# Patient Record
Sex: Male | Born: 1980 | Race: White | Hispanic: No | Marital: Married | Smoking: Never smoker
Health system: Southern US, Community
[De-identification: ages and names within clinical notes are randomized; demographics above are authoritative.]

## PROBLEM LIST (undated history)

## (undated) DIAGNOSIS — F410 Panic disorder [episodic paroxysmal anxiety] without agoraphobia: Secondary | ICD-10-CM

## (undated) DIAGNOSIS — F32A Depression, unspecified: Secondary | ICD-10-CM

## (undated) DIAGNOSIS — I10 Essential (primary) hypertension: Secondary | ICD-10-CM

## (undated) DIAGNOSIS — F209 Schizophrenia, unspecified: Secondary | ICD-10-CM

## (undated) DIAGNOSIS — F329 Major depressive disorder, single episode, unspecified: Secondary | ICD-10-CM

## (undated) DIAGNOSIS — F319 Bipolar disorder, unspecified: Secondary | ICD-10-CM

## (undated) DIAGNOSIS — Z87442 Personal history of urinary calculi: Secondary | ICD-10-CM

## (undated) DIAGNOSIS — F419 Anxiety disorder, unspecified: Secondary | ICD-10-CM

## (undated) HISTORY — PX: HERNIA REPAIR: SHX51

---
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
## 2002-01-01 ENCOUNTER — Emergency Department (HOSPITAL_COMMUNITY): Admission: EM | Admit: 2002-01-01 | Discharge: 2002-01-01 | Payer: Self-pay | Admitting: Internal Medicine

## 2002-07-25 ENCOUNTER — Inpatient Hospital Stay (HOSPITAL_COMMUNITY): Admission: EM | Admit: 2002-07-25 | Discharge: 2002-07-26 | Payer: Self-pay | Admitting: Internal Medicine

## 2002-07-25 ENCOUNTER — Encounter: Payer: Self-pay | Admitting: Internal Medicine

## 2005-03-26 ENCOUNTER — Emergency Department (HOSPITAL_COMMUNITY): Admission: EM | Admit: 2005-03-26 | Discharge: 2005-03-26 | Payer: Self-pay | Admitting: Emergency Medicine

## 2005-04-14 ENCOUNTER — Ambulatory Visit (HOSPITAL_COMMUNITY): Admission: RE | Admit: 2005-04-14 | Discharge: 2005-04-14 | Payer: Self-pay | Admitting: Urology

## 2008-11-05 ENCOUNTER — Emergency Department (HOSPITAL_COMMUNITY): Admission: EM | Admit: 2008-11-05 | Discharge: 2008-11-05 | Payer: Self-pay | Admitting: Emergency Medicine

## 2011-03-31 NOTE — Discharge Summary (Signed)
   NAMEDREZDEN, SEITZINGER                       ACCOUNT NO.:  [id number]   MEDICAL RECORD NO.:  [id number]                   PATIENT TYPE:  INP   LOCATION:  IC03                                 FACILITY:  [facility]   PHYSICIAN:  Gracelyn Nurse, M.D.              DATE OF BIRTH:  [birth year]   DATE OF ADMISSION:  07/25/2002  DATE OF DISCHARGE:  07/26/2002                                 DISCHARGE SUMMARY   DISCHARGE DIAGNOSES:  1. Drug overdose with Benadryl.  2. Schizoaffective disorder:  The patient discharged to [facility].  3. History of multiple suicide attempts.   DISCHARGE MEDICATIONS:  1. Keppra 1 g b.i.d.  2. Abilify 35 mg q.a.m.  3. Lamictal 300 mg q.h.s.  4. Synthroid 125 mcg q.d.  5. Risperdal  0.5 mg b.i.d.  6. Klonopin 2 mg q.a.m., 1 p.m., and q.h.s.   REASON FOR ADMISSION:  This is a 30 year old white male.  He had multiple  admissions for suicide attempts in the past.  He presents after taking  approximately 100 over-the-counter Benadryl tablets.  The patient is unable  to give information but his mother is present who was able to give a good  history.   HOSPITAL COURSE:  1. Drug overdose.  As noted above, this was with Benadryl.  The patient was     tachycardic, had dilated pupils, but other than this did not have any     other ill effects.  He was medically cleared by the next day.  At that     time psychiatry saw him.  They did recommend voluntary commitment at     [facility], and the patient will be transferred there.  2. Schizoaffective disorder.  He was maintained on his current medications     and this will be followed up during the psych evaluation.   DISPOSITION:  The patient was discharged in stable condition.                                                Gracelyn Nurse, M.D.    JDJ/MEDQ  D:  08/25/2002  T:  08/27/2002  Job:  [id number]

## 2011-03-31 NOTE — Discharge Summary (Signed)
[facility]  Patient:    Devin Allen, Devin Allen Visit Number: [id number] MRN: [id number]          Service Type: PSY Location: 500 0500 02 Attending Physician:  Denny Peon Dictated by:   Reymundo Poll Dub Mikes, M.D. Admit Date:  09/21/2001 Discharge Date: 09/24/2001                             Discharge Summary  CHIEF COMPLAINT AND HISTORY OF PRESENT ILLNESS:  This was the first admission to [facility] for this 30 year old male who came to the emergency room at [facility] taken by the mother as he had taken eight Tylenol tablets to go sleep.  Reported suicidal thoughts intermittently since high school, confused thinking, and rapid thoughts.  Denies any auditory or visual hallucinations, sleeping 12 hours a day, withdrawn, rocking in a chair. Initially felt better on Celexa, then suicidal thoughts returned.  He promised safety while on the unit.  Denied active homicidal ideas.  Confused about sexual identity.  Graphic dreams of death.  PAST PSYCHIATRIC HISTORY:  [facility], [facility] October 10 to October 25.  Has history of prior suicide attempts in 2000.  FAMILY HISTORY:  Schizoaffective disorder.  SUBSTANCE ABUSE HISTORY:  Denies the use or abuse of any substances.  PAST MEDICAL HISTORY:  Noncontributory.  MEDICATIONS ON ADMISSION: 1. Celexa 40 mg per day. 2. Risperdal 4 mg at bedtime. 3. Cogentin 1 mg at bedtime.  PHYSICAL EXAMINATION:  GENERAL:  Performed and failed to show any acute findings.  MENTAL STATUS EXAMINATION ON ADMISSION:  Well-nourished, well-developed male dressed in inappropriate dress in stocking cap and jacket in very warm room, fidgety.  Speech: There was no pressure, normal pace and tone, some difficulty verbalizing what was going on.  Mood: Depressed.  Affect: Mildly guarded.  Did admit to intrusive thoughts, morbid dreams.  Thought processes: Kind of fragmented  and disorganized.  Admitted to suicidal ideas but no plan; no homicidal ideas.  Oriented to person, place, and time.  Recent and remote memory were well preserved.  ADMITTING DIAGNOSES: Axis I:    1. Schizoaffective disorder, depressed.            2. Rule out psychosis, not otherwise specified.            3. Major depression with psychotic features. Axis II:   No diagnosis. Axis III:  No diagnosis. Axis IV:   Moderate. Axis V:    Global assessment of functioning upon admission 30, highest global            assessment of functioning in the last year 65-70.  HOSPITAL COURSE:  He was admitted and started in intensive individual and group psychotherapy.  He was kept on Risperdal 4 mg every day, Celexa 40 mg, and Cogentin.  Risperdal was decreased and he was started on Geodon 20 mg in the morning and 40 mg at bedtime.  By November 11, he felt he was feeling much better, still was somewhat reserved, guarded, but clearing to himself why he was in hospital, the triggers, what he needed to do to prevent to have to come back into the hospital.  There was a family session where mother said that he was definitely much better.  On November 12, he was in full contact with reality, mood improved, affect bright, no suicidal ideas, no homicidal ideas, no evidence of active psychosis.  As he was willing to pursue outpatient followup, he was discharged.  DISCHARGE DIAGNOSES: Axis I:    Rule out schizoaffective disorder, depressed versus psychotic            disorder, not otherwise specified. Axis II:   No diagnosis. Axis III:  No diagnosis. Axis IV:   Moderate. Axis V:    Global assessment of functioning upon discharge 60.  DISCHARGE MEDICATIONS: 1. Celexa 40 mg per day. 2. Cogentin 0.5 mg at bedtime. 3. Risperdal 1 mg at bedtime. 4. Geodon 20 mg in the morning and 40 mg at bedtime.  FOLLOWUP:  [facility]. Dictated by:   Reymundo Poll Dub Mikes, M.D. Attending Physician:   Denny Peon DD:  10/30/01 TD:  11/02/01 Job: [id number]

## 2011-03-31 NOTE — H&P (Signed)
[facility]  Patient:    Devin Allen, Devin Allen Visit Number: [id number] MRN: [id number]          Service Type: PSY Location: 500 0500 02 Attending Physician:  Denny Peon Dictated by:   Young Berry Scott, R.N. N.P. Admit Date:  09/21/2001 Discharge Date: 09/24/2001                     Psychiatric Admission Assessment  DATE OF ADMISSION:  September 21, 2001  DATE OF ASSESSMENT:  September 21, 2001, at 1:15 p.m.  PATIENT IDENTIFICATION:  This is an 30 year old male who is single, Caucasian. He is a voluntary admission.  HISTORY OF PRESENT ILLNESS:  This patient presented to the emergency room at [facility] and was taken there by his mother after he told her that he had taken eight Tylenol tablets "to go to sleep."  The patient had reported suicidal thoughts intermittently since high school along with confused thinking and rapid thoughts.  He denies any auditory or visual hallucinations when asked directly.  He does describe "intrusive thoughts" and was concerned that in his own mind he was questioning his sexual preference.  However, he notes that these are primarily intrusive thoughts, that he has never had a homosexual relationship and, in fact, he had a heterosexual relationship for some time in high school but broke it off because of the stress that it placed on him.  The patient reports that he has had graphic dreams of death and had these dreams even intermittently back in high school but they have been more prominent over the last one to two months.  The patient is able to promise safety on the unit.  He denies any homicidal ideation.  PAST PSYCHIATRIC HISTORY:  The patient is followed by [facility] where he has been seen one time since his discharge from [facility] where he was treated October 10 through October 25.  That was the patients first psychiatric admission.  This admission to [facility] is his first admission here and his second psychiatric admission.  The patient has a history of prior suicide attempt by overdose in October.  SUBSTANCE ABUSE HISTORY:  The patient is a nonsmoker.  He is a nondrinker.  He denies any use of illegal drugs.  PAST MEDICAL HISTORY:  The patient is followed by Dr. Foster Simpson in [place] and the patient was last seen there three years ago.  More recently he has been followed at [facility] on an as needed basis.  He denies any current medical problems.  He does have a history of hernia repair twice when he was a small child and denies any other history of current medical problems or surgeries.  MEDICATIONS: 1. Celexa 40 mg p.o. q.d. 2. Risperdal 4 mg p.o. q.h.s. 3. Cogentin 1 mg p.o. q.h.s.  DRUG ALLERGIES:  PENICILLIN.  PHYSICAL EXAMINATION:  GENERAL:  The patients physical examination was unremarkable in the [facility].  He is a well-nourished, well-developed, healthy appearing young male who is in no acute distress.  VITAL SIGNS:  On admission to the unit, the patient weighed 208 pounds. Temperature was 97.9, pulse 102, blood pressure 139/71.  LABORATORY DATA:  Urine drug screen was negative.  CBC was within normal limits.  Chemistry was within normal limits.  Creatinine 1.1, BUN 13.  Tylenol level was within acceptable limits as validated by the emergency room prior to  transfer.  SOCIAL HISTORY:  The patient is the younger of two siblings.  He was at [employer] studying general college subjects but then dropped out because feeling that college was too stressful for him.  He was educated through high school, states that he was able to make As and Bs by studying very little or not at all.  The patients father died when he was a Printmaker in high school of cancer.  The patient currently lives with his mother and his brother.  He has  never married.  He has no children.  He is currently not working.  He denies any legal problems or any specific financial problems.  FAMILY HISTORY:  Positive for a father with a history of either schizoaffective disorder or schizophrenia; this is unclear.  MENTAL STATUS EXAMINATION:  This is a healthy appearing white male who is inappropriately dressed in a stocking cap and a heavy jacket in a warm room. He is fidgeting with poor eye contact, looking from side to side during the interview and having difficulty formulating his words.  Speech is without pressure but is soft in tone; otherwise normal pace.  He has quite a bit of difficulty verbalizing his thoughts and response latency is noted.  Mood is depressed and mildly guarded.  Thought process is fragmented and disjointed and remarkable for his intrusive thoughts and morbid dreams.  He does appear to have some internal stimulation going on.  He is positive for suicidal ideation without any specific plan or intent and no evidence of homicidal ideation.  Cognitive: Intact x 4.  It is noted that Dr. Lourdes Sledge is in on session for his mental status examination and history and formulates the plan.  ADMISSION DIAGNOSES: Axis I:    Rule out schizoaffective disorder. Axis II:   Deferred. Axis III:  None. Axis IV:   Deferred. Axis V:    Current 30, past year 72.  INITIAL PLAN OF CARE:  Plan is to voluntarily admit the patient to stabilize his mood and to alleviate his suicidal ideation and to help improve his reality testing.  We will plan a family session with his mother, Nettie Elm, as soon as possible.  Meanwhile, we will decrease his Risperdal to 2 mg at h.s., decrease his Cogentin to 0.5 mg at h.s. and then q.6h. p.r.n. for EPS, and we will start him on Geodon 20 mg at h.s. tonight and then 20 mg b.i.d. tomorrow. The risks and benefits of the Geodon were discussed with the patient and he is agreeable to this plan.  ESTIMATED LENGTH OF  STAY:  Four to six days. Dictated by:   Young Berry Scott, R.N. N.P. Attending Physician:  Denny Peon DD:  09/30/01  TD:  09/30/01 Job: [id number]

## 2011-03-31 NOTE — H&P (Signed)
Devin Allen, Devin Allen                       ACCOUNT NO.:  [id number]   MEDICAL RECORD NO.:  [id number]                   PATIENT TYPE:  EMS   LOCATION:  ED                                   FACILITY:  [facility]   PHYSICIAN:  Hanley Hays. Dechurch, M.D.           DATE OF BIRTH:  [birth year]   DATE OF ADMISSION:  07/25/2002  DATE OF DISCHARGE:                                HISTORY & PHYSICAL   HISTORY OF PRESENT ILLNESS:  The patient is 30 year old Caucasian male  followed by Dr. Renard Matter with a past medical history remarkable for  schizoaffective disorder followed by Dr. Joaquin Courts, his  psychologist.  He has had multiple inpatient psychiatric admissions and  multiple suicide attempts since diagnosis October 2002.  Tonight the patient  presents after taking approximately 100 over-the-counter Benadryl tablets.  The patient is unable to give any information but his mother is present, is  a good informant.  Other than his schizoaffective disorder which has been  quite troubling, he also has hypothyroidism which was recently diagnosed.  Past surgical history pertinent for hernia repair x2 as a child.  The  patient received ipecac in the emergency room and did have some vomiting but  no pill fragments.  He is being admitted to the intensive care unit for  observation and clearance prior to being transferred to inpatient psychiatry  for suicidal ideation and attempt.   ALLERGIES:  PENICILLIN.   MEDICATIONS:  1. Keppra 1 g b.i.d.  2. Abilify 30 mg q.a.m.  3. Lamictal 300 mg q.h.s.  4. Synthroid 125 mcg q.d.  5. Risperdal 0.5 mg b.i.d. (new).  6. Klonopin 2 mg q.a.m., 1 q.p.m., and 1 q.h.s.   SOCIAL HISTORY:  The patient is a Consulting civil engineer.  He did not start having problems  that were overt until the beginning of his freshman year at [employer].  No tobacco or alcohol or drug use.  He has one brother who is alive  and well.  His father deserted them in 1989.   FAMILY MEDICAL  HISTORY:  Pertinent for father with schizoaffective disorder.   PHYSICAL EXAMINATION:  GENERAL:  An obese young male who is very difficult  to understand secondary to his speech, which is abnormal.  He is able to  answer some simple questions, follows simple commands.  VITAL SIGNS:  Pulse 130, respirations unlabored, blood pressure on  presentation 165/98, O2 saturations 99%.  HEENT:  The oropharynx is dry.  His pupils are dilated.  Teeth are in fair  repair.  NECK:  Supple.  No JVD or adenopathy.  LUNGS:  Clear anteriorly and posteriorly though diminished secondary to  habitus.  HEART:  Tachycardic and regular.  ABDOMEN:  Reveals striae, soft, positive bowel sounds.  EXTREMITIES:  Without clubbing, cyanosis, or edema.  His pulses are intact.  GENITOURINARY:  The patient has a Foley catheter in place.  SKIN:  Otherwise without rash, lesion, or  breakdown.  NEUROLOGIC:  The patient moves all extremities.  He can answer some simple  questions though difficult to understand secondary to dysarthria.  He is  somewhat confused which, of course, is not his baseline state.   ASSESSMENT AND PLAN:  1. Suicide attempt with intended Benadryl overdose.  His mother keeps his     medications and he only receives them one week at a time; therefore, it     is not suspected there have been any other medications involved.  The     Benadryl bottle was found at the seen and produced by the patient.  Will     admit to ICU with telemetry, with IV fluids support and monitoring and     treating untoward effects of the anticholinergic response.  The patient     is mildly hypokalemic and alkalotic.  Will gently replete his potassium,     follow BMP.  He does have some mild increase in his SGOT, SGPT, but is on     Lamictal as well as has been on Depakote.  No previous labs available to     compare to.  2. Mild hypoxemia.  Blood gas revealed a PO2 of 65.  Will supplement and     monitor.   The ACT team will be  consulted once the patient is stable from the medical  standpoint.  The patient's status was discussed with his mother, who has  good understanding.                                               Hanley Hays Josefine Class, M.D.    FED/MEDQ  D:  07/25/2002  T:  07/26/2002  Job:  [id number]

## 2011-11-14 DIAGNOSIS — R569 Unspecified convulsions: Secondary | ICD-10-CM

## 2011-11-14 HISTORY — DX: Unspecified convulsions: R56.9

## 2011-12-23 ENCOUNTER — Other Ambulatory Visit: Payer: Self-pay

## 2011-12-23 ENCOUNTER — Emergency Department (HOSPITAL_COMMUNITY)
Admission: EM | Admit: 2011-12-23 | Discharge: 2011-12-23 | Disposition: A | Discharge status: Home / Self Care | Payer: Medicare Other | Attending: Emergency Medicine | Admitting: Emergency Medicine

## 2011-12-23 ENCOUNTER — Encounter (HOSPITAL_COMMUNITY): Payer: Self-pay

## 2011-12-23 ENCOUNTER — Emergency Department (HOSPITAL_COMMUNITY): Payer: Medicare Other

## 2011-12-23 DIAGNOSIS — R404 Transient alteration of awareness: Secondary | ICD-10-CM | POA: Insufficient documentation

## 2011-12-23 DIAGNOSIS — R55 Syncope and collapse: Secondary | ICD-10-CM | POA: Insufficient documentation

## 2011-12-23 DIAGNOSIS — F3289 Other specified depressive episodes: Secondary | ICD-10-CM | POA: Insufficient documentation

## 2011-12-23 DIAGNOSIS — R42 Dizziness and giddiness: Secondary | ICD-10-CM | POA: Insufficient documentation

## 2011-12-23 DIAGNOSIS — M79609 Pain in unspecified limb: Secondary | ICD-10-CM | POA: Insufficient documentation

## 2011-12-23 DIAGNOSIS — F329 Major depressive disorder, single episode, unspecified: Secondary | ICD-10-CM | POA: Insufficient documentation

## 2011-12-23 DIAGNOSIS — R413 Other amnesia: Secondary | ICD-10-CM | POA: Insufficient documentation

## 2011-12-23 HISTORY — DX: Major depressive disorder, single episode, unspecified: F32.9

## 2011-12-23 HISTORY — DX: Depression, unspecified: F32.A

## 2011-12-23 LAB — CBC
HCT: 37.2 % — ABNORMAL LOW (ref 39.0–52.0)
Hemoglobin: 12.8 g/dL — ABNORMAL LOW (ref 13.0–17.0)
MCHC: 34.4 g/dL (ref 30.0–36.0)

## 2011-12-23 LAB — COMPREHENSIVE METABOLIC PANEL
BUN: 14 mg/dL (ref 6–23)
CO2: 25 mEq/L (ref 19–32)
Chloride: 105 mEq/L (ref 96–112)
Creatinine, Ser: 1.01 mg/dL (ref 0.50–1.35)
GFR calc Af Amer: >90 mL/min (ref >90)
GFR calc non Af Amer: >90 mL/min (ref >90)
Glucose, Bld: 92 mg/dL (ref 70–99)
Total Bilirubin: 0.3 mg/dL (ref 0.3–1.2)

## 2011-12-23 LAB — DIFFERENTIAL
Basophils Relative: 0 % (ref 0–1)
Lymphs Abs: 1.2 10*3/uL (ref 0.7–4.0)
Monocytes Absolute: 0.5 10*3/uL (ref 0.1–1.0)
Monocytes Relative: 7 % (ref 3–12)
Neutro Abs: 5.3 10*3/uL (ref 1.7–7.7)

## 2011-12-23 MED ORDER — SODIUM CHLORIDE 0.9 % IV SOLN
Freq: Once | INTRAVENOUS | Status: AC
  Administered 2011-12-23: 20:00:00 via INTRAVENOUS

## 2011-12-23 NOTE — ED Notes (Signed)
Started zyprexa 2 days ago, pt does not remember yelling out on phone, just remembers being dizzy.

## 2011-12-23 NOTE — ED Notes (Signed)
Per mother pt began yelling out, then pt "blacked out", pt was found in the floor by family members.

## 2011-12-23 NOTE — ED Provider Notes (Signed)
History   This chart was scribed for Geoffery Lyons, MD by Melba Coon. The patient was seen in room APA04/APA04 and the patient's care was started at 7:05PM.    CSN: [id number]  Arrival date & time 12/23/11  4098   First MD Initiated Contact with Patient 12/23/11 1851      Chief Complaint  Patient presents with  . Loss of Consciousness    (Consider location/radiation/quality/duration/timing/severity/associated sxs/prior treatment) HPI Devin Allen is a 31 y.o. male who presents to the Emergency Department complaining of a moderate to severe episode of LOC with an onset this evening. Pt was talking on the phone with his mother when the mother heard the pt yell "Ahhhh" like somebody was chasing him. Mother tried to get pt to respond on the phone and failed; mother then called brother to go to the house and check on the pt. Mother then called for EMS. Brother found pt on the floor unconscious laying between the dining room and living room. Pt states that he felt lightheaded about 2 hrs before he passed and does not remember the conversation with his mother or his mother yelling at him on the phone. Pt states that he does not fell like himself and he feels "out of it". Mother also states that pt is on new medication (Zyprexa) started 3 days ago.  Right leg pain pertaining to fall present. No SOB, neck pain, CP, abd pain, or incontinence. Hx of depression. No Hx of seizures, diabetes, or heart problems.  Past Medical History  Diagnosis Date  . Depression     Past Surgical History  Procedure Date  . Hernia repair     No family history on file.  History  Substance Use Topics  . Smoking status: Never Smoker   . Smokeless tobacco: Not on file  . Alcohol Use: No      Review of Systems 10 Systems reviewed and are negative for acute change except as noted in the HPI.  Allergies  Review of patient's allergies indicates not on file.  Home Medications  No current outpatient  prescriptions on file.  BP 116/55  Pulse 120  Temp(Src) 97.6 F (36.4 C) (Oral)  Resp 20  Ht 6' (1.829 m)  Wt 190 lb (86.183 kg)  BMI 25.77 kg/m2  SpO2 98%  Physical Exam  Nursing note and vitals reviewed. Constitutional: He appears well-developed and well-nourished.       Awake, alert, nontoxic appearance.  HENT:  Head: Normocephalic and atraumatic.  Right Ear: External ear normal.  Left Ear: External ear normal.  Mouth/Throat: Oropharynx is clear and moist. No oropharyngeal exudate.  Eyes: EOM are normal. Pupils are equal, round, and reactive to light. Right eye exhibits no discharge. Left eye exhibits no discharge.  Neck: Normal range of motion. Neck supple.  Cardiovascular: Normal rate, regular rhythm and normal heart sounds.   No murmur heard. Pulmonary/Chest: Effort normal and breath sounds normal. No stridor. No respiratory distress. He has no wheezes. He has no rales. He exhibits no tenderness.  Abdominal: Soft. Bowel sounds are normal. He exhibits no mass. There is no tenderness. There is no rebound.  Musculoskeletal: He exhibits no tenderness.       Baseline ROM, no obvious new focal weakness.  Lymphadenopathy:    He has no cervical adenopathy.  Neurological:       Awake, alert, cooperative and aware of situation; motor strength bilaterally; sensation normal to light touch bilaterally; peripheral visual fields full to confrontation; no facial  asymmetry; tongue midline; major cranial nerves appear intact.  Skin: Skin is warm. No rash noted.  Psychiatric: He has a normal mood and affect.    ED Course  Procedures (including critical care time)  DIAGNOSTIC STUDIES: Oxygen Saturation is 98% on room air, normal by my interpretation.    COORDINATION OF CARE:     Labs Reviewed - No data to display No results found.   No diagnosis found.   Date: 12/23/2011  Rate: 107  Rhythm: sinus tachycardia  QRS Axis: normal  Intervals: normal  ST/T Wave abnormalities:  normal  Conduction Disutrbances:none  Narrative Interpretation:   Old EKG Reviewed: none available    MDM  Labs, ekg, ct all okay.  I am unsure as to what happened whether this was a seizure, syncopal episode, or reaction to medication.  His vital signs were somewhat orthostatic, will advise holding the propranolol for now.  I personally performed the services described in this documentation, which was scribed in my presence. The recorded information has been reviewed and considered.         Geoffery Lyons, MD 12/23/11 2101

## 2011-12-23 NOTE — ED Notes (Addendum)
Patient does not remember talking to his mother on the phone. States he remembers talking to his brother and he remembers his uncle coming in the door. Patient states that he is sort of out of it, not feeling like himself.  Last time eating or drinking was around 2pm today.

## 2011-12-24 ENCOUNTER — Emergency Department (HOSPITAL_COMMUNITY)
Admission: EM | Admit: 2011-12-24 | Discharge: 2011-12-24 | Disposition: A | Discharge status: Home / Self Care | Payer: Medicare Other | Attending: Emergency Medicine | Admitting: Emergency Medicine

## 2011-12-24 ENCOUNTER — Encounter (HOSPITAL_COMMUNITY): Payer: Self-pay

## 2011-12-24 DIAGNOSIS — F3289 Other specified depressive episodes: Secondary | ICD-10-CM | POA: Insufficient documentation

## 2011-12-24 DIAGNOSIS — R569 Unspecified convulsions: Secondary | ICD-10-CM | POA: Insufficient documentation

## 2011-12-24 DIAGNOSIS — F329 Major depressive disorder, single episode, unspecified: Secondary | ICD-10-CM | POA: Insufficient documentation

## 2011-12-24 MED ORDER — LORAZEPAM 1 MG PO TABS
1.0000 mg | ORAL_TABLET | Freq: Once | ORAL | Status: AC
  Administered 2011-12-24: 1 mg via ORAL
  Filled 2011-12-24: qty 1

## 2011-12-24 MED ORDER — DIVALPROEX SODIUM 250 MG PO DR TAB: 250.0000 mg | DELAYED_RELEASE_TABLET | Freq: Three times a day (TID) | ORAL | Status: DC

## 2011-12-24 MED ORDER — ONDANSETRON 4 MG PO TBDP
4.0000 mg | ORAL_TABLET | Freq: Once | ORAL | Status: AC
  Administered 2011-12-24: 4 mg via ORAL
  Filled 2011-12-24: qty 1

## 2011-12-24 MED ORDER — DIVALPROEX SODIUM 250 MG PO DR TAB
500.0000 mg | DELAYED_RELEASE_TABLET | Freq: Once | ORAL | Status: AC
  Administered 2011-12-24: 500 mg via ORAL
  Filled 2011-12-24: qty 2

## 2011-12-24 MED ORDER — ACETAMINOPHEN 325 MG PO TABS
650.0000 mg | ORAL_TABLET | Freq: Once | ORAL | Status: AC
  Administered 2011-12-24: 650 mg via ORAL
  Filled 2011-12-24: qty 2

## 2011-12-24 NOTE — ED Provider Notes (Signed)
History     CSN: [id number]  Arrival date & time 12/24/11  0001   First MD Initiated Contact with Patient 12/24/11 0014      Chief Complaint  Patient presents with  . Seizures    (Consider location/radiation/quality/duration/timing/severity/associated sxs/prior treatment) HPI Comments: 31 year old male with a history of bipolar disorder who presents after what appears to be his second seizure of the day. He has never had seizures in the past until today when he was found earlier in the day to have a loss of consciousness while talking on the phone, found on the floor by family members several minutes thereafter. He states that over the last couple hours she was feeling fine but then again his mother heard her yell to followed by seizure-like activity that was witnessed by the family members when he had fallen out of his chair at the computer desk. The seizure activity was tonic clonic in nature, ice and rolled back and he was unresponsive to physical or verbal stimuli. This lasted for several minutes, fatigue and stopped and he was immediately confused, this is gradually improved but even at this time the mother states he is not back to baseline. She states that in the last 2 days he has started olanzapine as further treatment for his bipolar disorder. Unknown side effect of this medication is seizures. He denies any trauma to his tongue, extremities, back or neck pain, no urinary incontinence  Patient is a 31 y.o. male presenting with seizures. The history is provided by the patient, a parent and medical records.  Seizures     Past Medical History  Diagnosis Date  . Depression     Past Surgical History  Procedure Date  . Hernia repair     No family history on file.  History  Substance Use Topics  . Smoking status: Never Smoker   . Smokeless tobacco: Not on file  . Alcohol Use: No      Review of Systems  Neurological: Positive for seizures.  All other systems reviewed and  are negative.    Allergies  Penicillins  Home Medications   Current Outpatient Rx  Name Route Sig Dispense Refill  . BUPROPION HCL ER (XL) 300 MG PO TB24 Oral Take 300 mg by mouth at bedtime.    . FLUOXETINE HCL 20 MG PO CAPS Oral Take 20 mg by mouth daily.    [name] LORAZEPAM 1 MG PO TABS Oral Take 1 mg by mouth 4 (four) times daily as needed.    [name] PALIPERIDONE PALMITATE 234 MG/1.5ML IM SUSP Intramuscular Inject 234 mg into the muscle every 30 (thirty) days. Patient had last dose on January 31st....    . PROPRANOLOL HCL 40 MG PO TABS Oral Take 40 mg by mouth 2 (two) times daily.    [name] ZOLPIDEM TARTRATE 10 MG PO TABS Oral Take 10 mg by mouth at bedtime.    [name] DIVALPROEX SODIUM 250 MG PO TBEC Oral Take 1 tablet (250 mg total) by mouth 3 (three) times daily. 90 tablet 0  . LOXAPINE SUCCINATE 25 MG PO CAPS Oral Take 25 mg by mouth 2 (two) times daily.      BP 111/51  Pulse 95  Temp(Src) 98.1 F (36.7 C) (Oral)  Resp 18  Ht 6' (1.829 m)  Wt 195 lb (88.451 kg)  BMI 26.45 kg/m2  SpO2 100%  Physical Exam  Nursing note and vitals reviewed. Constitutional: He appears well-developed and well-nourished. No distress.  HENT:  Head: Normocephalic and  atraumatic.  Mouth/Throat: Oropharynx is clear and moist. No oropharyngeal exudate.  Eyes: Conjunctivae and EOM are normal. Pupils are equal, round, and reactive to light. Right eye exhibits no discharge. Left eye exhibits no discharge. No scleral icterus.  Neck: Normal range of motion. Neck supple. No JVD present. No thyromegaly present.  Cardiovascular: Normal rate, regular rhythm, normal heart sounds and intact distal pulses.  Exam reveals no gallop and no friction rub.   No murmur heard. Pulmonary/Chest: Effort normal and breath sounds normal. No respiratory distress. He has no wheezes. He has no rales.  Abdominal: Soft. Bowel sounds are normal. He exhibits no distension and no mass. There is no tenderness.  Musculoskeletal: Normal range of  motion. He exhibits no edema and no tenderness.  Lymphadenopathy:    He has no cervical adenopathy.  Neurological: He is alert. Coordination normal.       Neurologic exam:  Speech clear, pupils equal round reactive to light, extraocular movements intact  Normal peripheral visual fields Cranial nerves III through XII normal including no facial droop Follows commands, moves all extremities x4, normal strength to bilateral upper and lower extremities at all major muscle groups including grip Sensation normal to light touch and pinprick Coordination intact, no limb ataxia, finger-nose-finger normal Rapid alternating movements normal No pronator drift Gait normal   Skin: Skin is warm and dry. No rash noted. No erythema.  Psychiatric: He has a normal mood and affect. His behavior is normal.    ED Course  Procedures (including critical care time)  Labs Reviewed - No data to display Ct Head Wo Contrast  12/23/2011  *RADIOLOGY REPORT*  Clinical Data:  Loss of consciousness.  CT HEAD WITHOUT CONTRAST  Technique:  Contiguous axial images were obtained from the base of the skull through the vertex without contrast  Comparison:  None.  Findings:  The brain has a normal appearance without evidence for hemorrhage, acute infarction, hydrocephalus, or mass lesion.  There is no extra axial fluid collection.  The skull and paranasal sinuses are normal.  IMPRESSION: Normal CT of the head without contrast.  Original Report Authenticated By: Camelia Phenes, M.D.     1. Seizure       MDM  At this time the patient has normal memory, normal neurologic exam and does not appear to have any sequela of a seizure. The report from his mother suggest that this was true tonoclonic seizure activity. This includes a postictal phase. I have discussed his care with the neurologist on call Dr. Gerilyn Pilgrim who agrees with starting Depakote 250 mg 3 times a day and following up in the office for an EEG in the coming week.  We'll give the medication here and provided observation over the next couple of hours. Will also need to stop olanzapine   No further seizures throughout his emergency department stay, vital signs have remained stable, Depakote started per the neurologist request, followup contact information given to the patient. Patient and mother expressed her understanding in stopping olanzapine and starting Depakote.  Prior medical chart reviewed showing CT scan showing no abnormalities, lab work which was normal from earlier in the day.     Vida Roller, MD 12/24/11 [phone]

## 2011-12-24 NOTE — ED Notes (Signed)
Paged Dr. Gerilyn Pilgrim for a neuro consult

## 2011-12-24 NOTE — ED Notes (Signed)
He went into his room and he yelled and he had came out of his computer chair and was under the desk; jerking; heavy breathing; eyes were wide open and wild looking; lasted about 3 minutes; got him up to the bed. Doesn't remember any of it per mother and patient. Denies loss of control of bowel or bladder. Patient alert and oriented x 3, skin warm and dry. Complaining of headache at this time.

## 2012-03-06 LAB — CBC
HCT: 38 % — ABNORMAL LOW (ref 40.0–52.0)
HGB: 13 g/dL (ref 13.0–18.0)
MCH: 31 pg (ref 26.0–34.0)
MCHC: 34.1 g/dL (ref 32.0–36.0)
MCV: 91 fL (ref 80–100)
RBC: 4.18 10*6/uL — ABNORMAL LOW (ref 4.40–5.90)
RDW: 12.5 % (ref 11.5–14.5)
WBC: 8.2 10*3/uL (ref 3.8–10.6)

## 2012-03-06 LAB — URINALYSIS, COMPLETE
Bilirubin,UR: NEGATIVE
Blood: NEGATIVE
Leukocyte Esterase: NEGATIVE
Ph: 6 (ref 4.5–8.0)
RBC,UR: <1 /HPF (ref 0–5)

## 2012-03-06 LAB — DRUG SCREEN, URINE
Amphetamines, Ur Screen: NEGATIVE (ref ?–1000)
Benzodiazepine, Ur Scrn: NEGATIVE (ref ?–200)
MDMA (Ecstasy)Ur Screen: NEGATIVE (ref ?–500)
Methadone, Ur Screen: NEGATIVE (ref ?–300)
Phencyclidine (PCP) Ur S: NEGATIVE (ref ?–25)

## 2012-03-06 LAB — COMPREHENSIVE METABOLIC PANEL
Albumin: 4.1 g/dL (ref 3.4–5.0)
Anion Gap: 7 (ref 7–16)
Chloride: 108 mmol/L — ABNORMAL HIGH (ref 98–107)
Co2: 26 mmol/L (ref 21–32)
Creatinine: 0.82 mg/dL (ref 0.60–1.30)
Glucose: 84 mg/dL (ref 65–99)
Potassium: 3.7 mmol/L (ref 3.5–5.1)
Sodium: 141 mmol/L (ref 136–145)
Total Protein: 7.1 g/dL (ref 6.4–8.2)

## 2012-03-07 ENCOUNTER — Inpatient Hospital Stay: Payer: Self-pay | Admitting: Psychiatry

## 2012-03-07 DIAGNOSIS — I499 Cardiac arrhythmia, unspecified: Secondary | ICD-10-CM

## 2012-03-07 LAB — FOLATE: Folic Acid: 19.3 ng/mL (ref 3.1–100.0)

## 2012-03-09 LAB — LIPID PANEL
HDL Cholesterol: 57 mg/dL (ref 40–60)
VLDL Cholesterol, Calc: 16 mg/dL (ref 5–40)

## 2012-09-30 IMAGING — CT CT HEAD W/O CM
1 of 2 series · 16 of 30 positions shown, 20 images · non-contrast
Comparison: None.

CLINICAL DATA: Loss of consciousness.

CT HEAD WITHOUT CONTRAST
TECHNIQUE: Contiguous axial images were obtained from the base of
the skull through the vertex without contrast

[Series 3: headtrauma 2.4 h60s · axial · 0.49mm/px · z∈[+102,+262]mm · 16 of 72 slices shown, 20 images]
[im 4/72  brain]
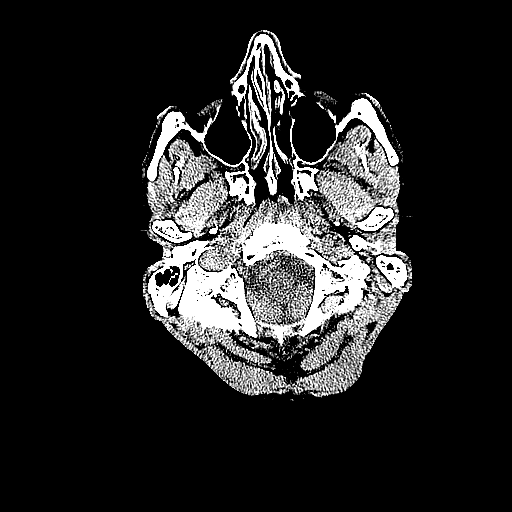
[im 4/72  bone]
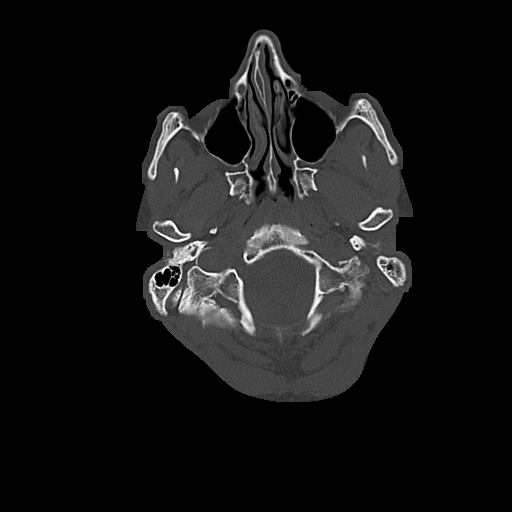
[im 8/72  brain]
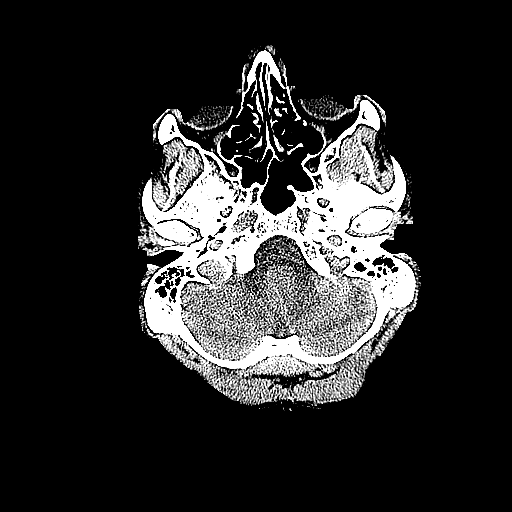
[im 12/72  brain]
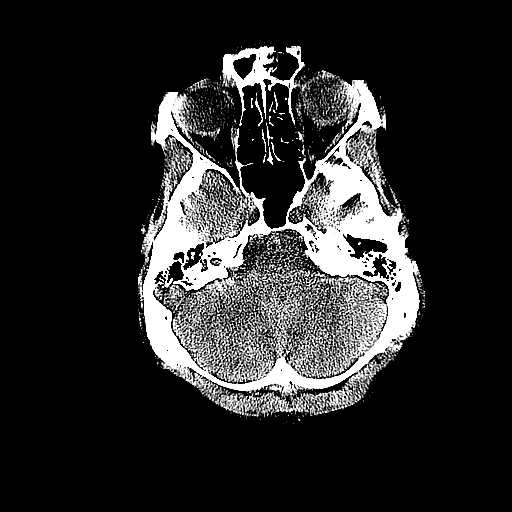
[im 15/72  brain]
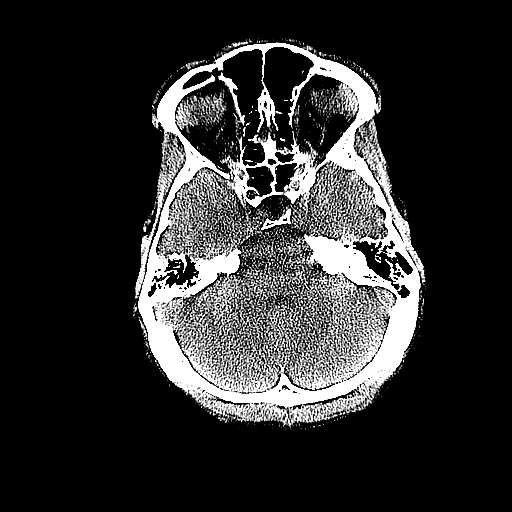
[im 23/72  brain]
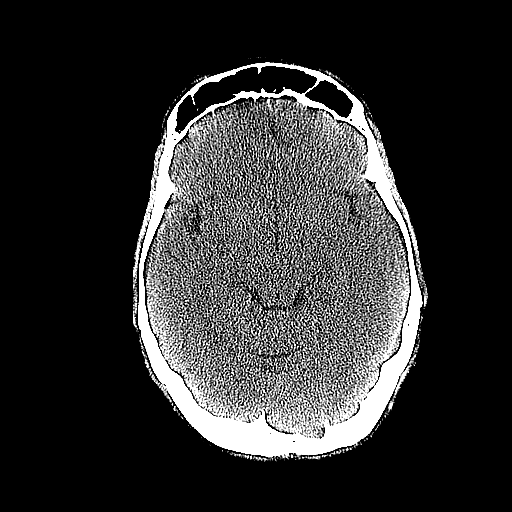
[im 23/72  bone]
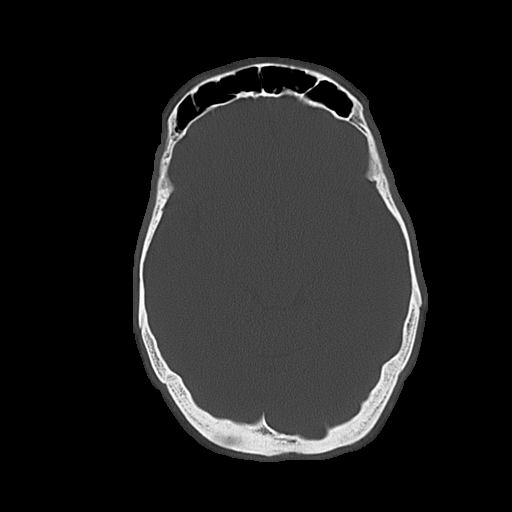
[im 27/72  brain]
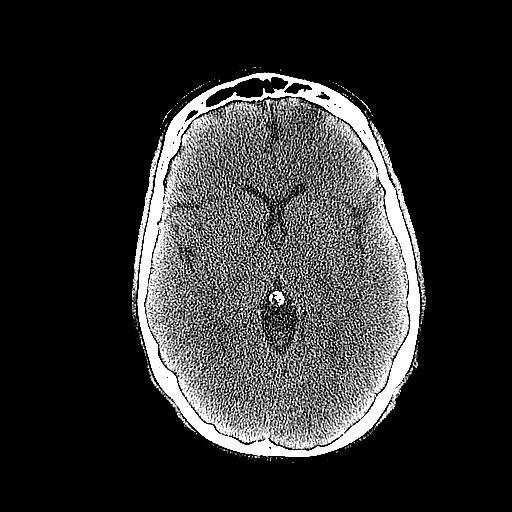
[im 30/72  brain]
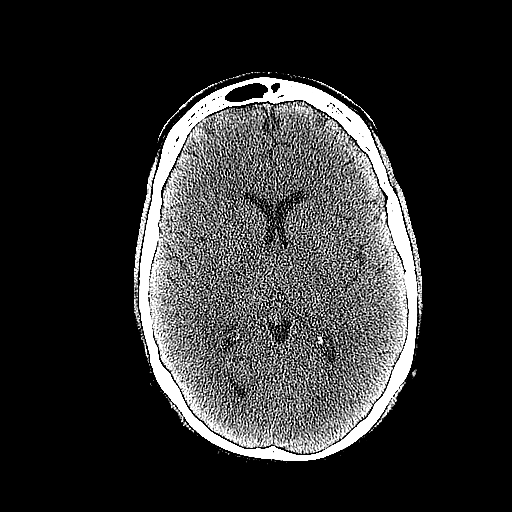
[im 34/72  brain]
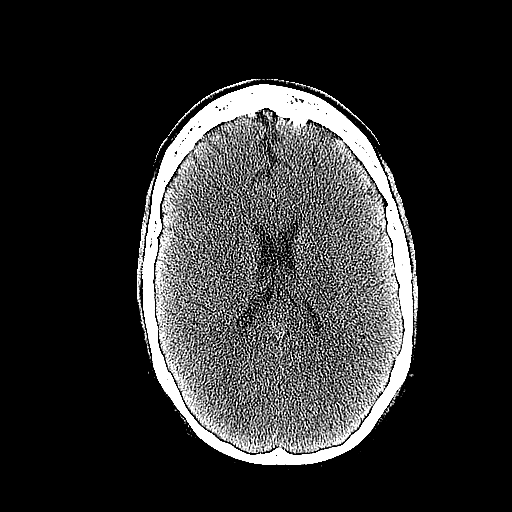
[im 38/72  brain]
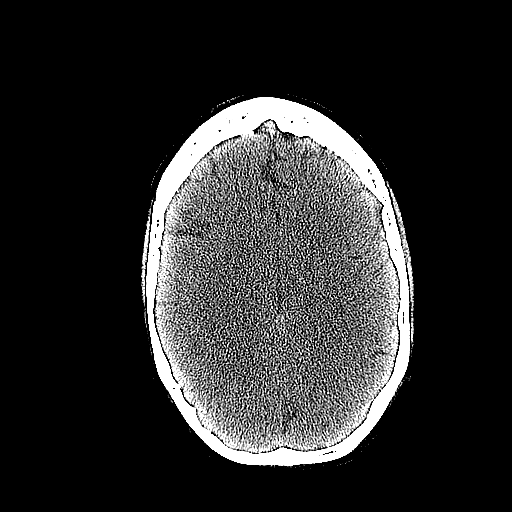
[im 38/72  bone]
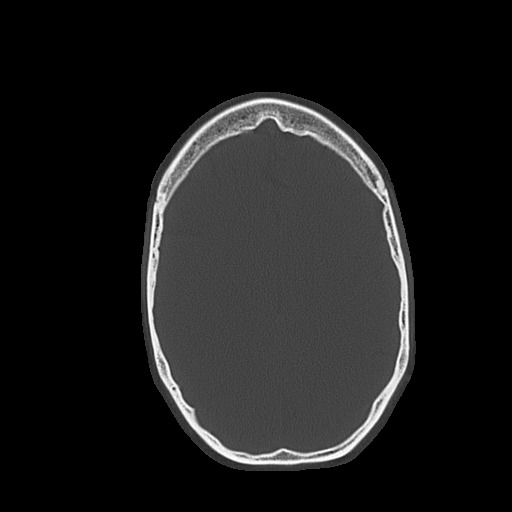
[im 42/72  brain]
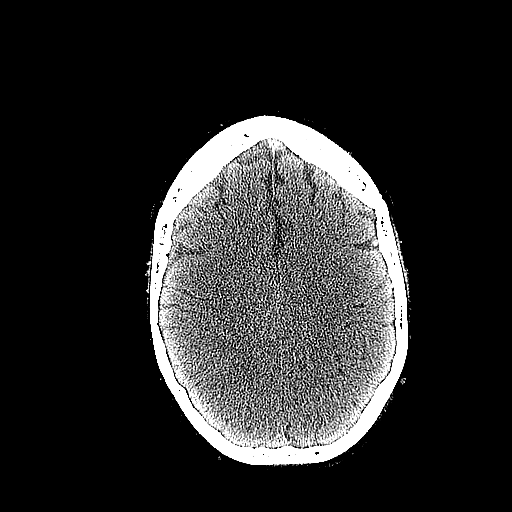
[im 45/72  brain]
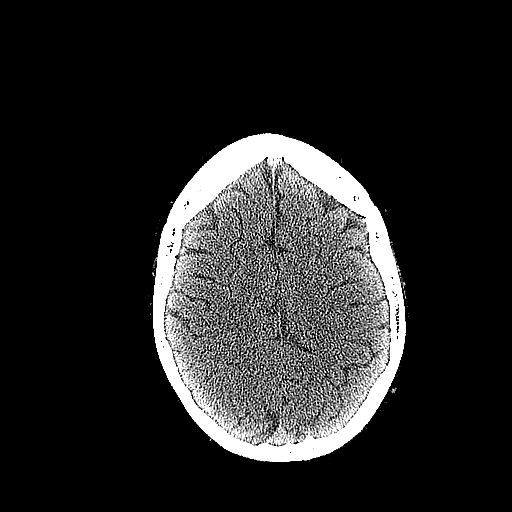
[im 49/72  brain]
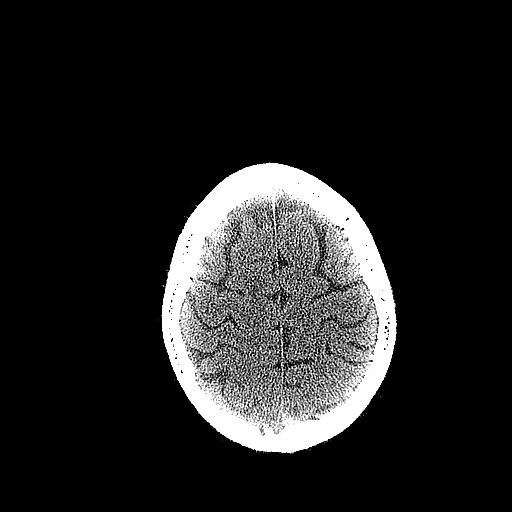
[im 57/72  brain]
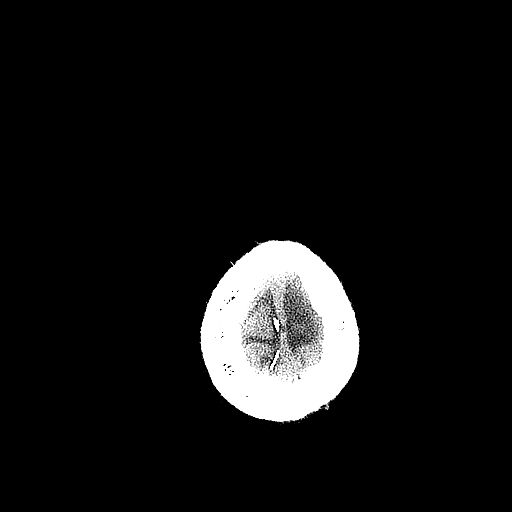
[im 57/72  bone]
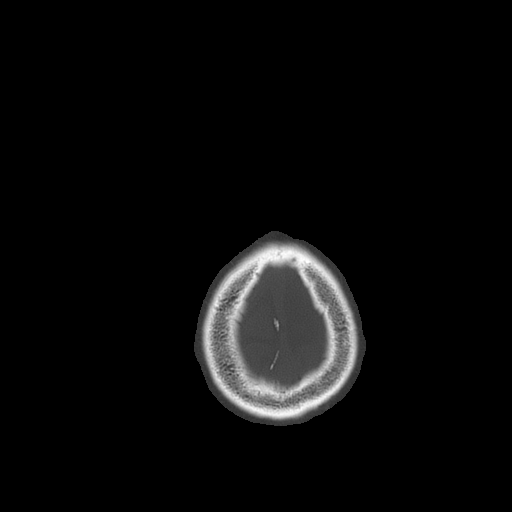
[im 60/72  brain]
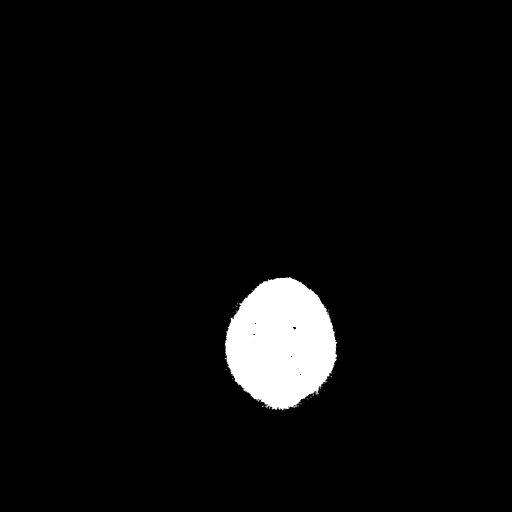
[im 64/72  brain]
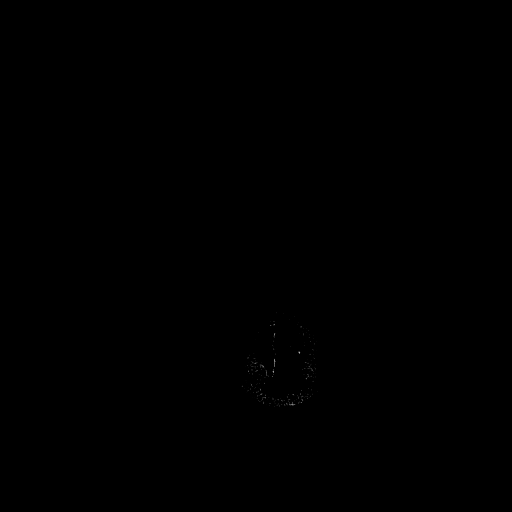
[im 68/72  brain]
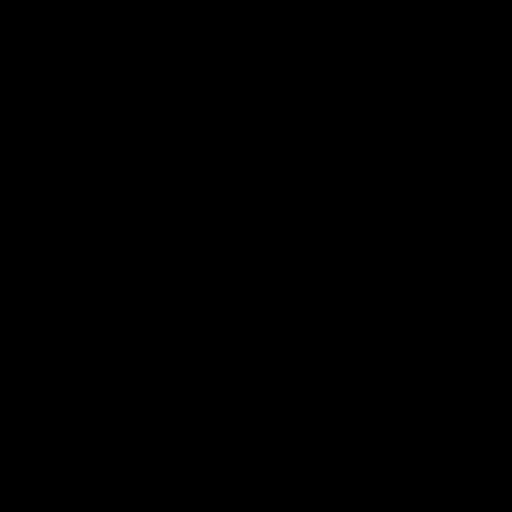

[16 of 30 positions shown; findings below may reference images not displayed]

FINDINGS: The brain has a normal appearance without evidence for
hemorrhage, acute infarction, hydrocephalus, or mass lesion.  There
is no extra axial fluid collection.  The skull and paranasal
sinuses are normal.
IMPRESSION: Normal CT of the head without contrast.

## 2015-03-07 NOTE — H&P (Signed)
PATIENT NAME:  Devin Allen, Devin Allen MR#:  [id number] DATE OF BIRTH:  04-Feb-1981  DATE OF ADMISSION:  03/07/2012  REFERRING PHYSICIAN: Joseph ArtAlice Finnell, M.D.  ADMITTING PHYSICIAN: Caryn SectionAarti Mame Twombly, M.D.   REASON FOR ADMISSION: Suicidal thoughts with a plan to have a suicide party.   IDENTIFYING INFORMATION: Devin Allen is a 34 year old single Caucasian male currently living with his mother in the [place] area, followed by Dr. Omelia BlackwaterHeaden in the past for schizoaffective disorder as well as borderline personality disorder and social anxiety.   HISTORY OF PRESENT ILLNESS: Devin Allen is a 34 year old single Caucasian male with multiple prior inpatient psychiatric hospitalizations at [facility] and [facility] who was directly referred to the Emergency Room by Dr. Omelia BlackwaterHeaden as he has been endorsing suicidal thoughts for the past one month with the plan to have a suicide party on his birthday, [birth year]. The patient says he has been obsessed and talking about suicide and thinking about it over the past one month. He has been trying to prepare his mother for life without him and has been wanting to buy books so that she can learn about grief and cope with death. The patient has been writing poetry about suicide as well. He plans to cut his wrist and take pills so that he can fall asleep while it is happening. The patient denies feeling depressed currently but says that he was depressed a few months ago. He had been started on Prozac about a month ago, but Prozac was discontinued on the day of admission in Dr. Lilia ProHeaden's office as his mother had reported that he had gotten more obsessed about suicide after the Prozac was started. The patient says that he has always wanted to commit suicide and he now feels like it is the right time. He says, "I am ready to leave. I know I will hurt people but I am trying to prepare them the best that I can."  His only recent stressors that he endorses are difficulties with school. He has a presentation  on circuit training on Monday on how to demonstrate an exercise routine, which has been very anxiety-provoking for him. He also endorses some problems with social anxiety and panic attacks around other people and says that his pottery class has been difficult for that reason. He denies any current psychotic symptoms including auditory or visual hallucinations. No paranoid thoughts or delusions. He does have a history of manic episodes in the past including racing thoughts, decreased sleep for several days at a time, and delusional thoughts. He said his last manic episode was several years ago. He denies any history of any heavy alcohol use or illicit drug use. Toxicology screen in the Emergency Room was negative for all substances.   PAST PSYCHIATRIC HISTORY: The patient has been hospitalized multiple times in the past at [facility], [facility], and [facility]. He is currently followed by Dr. Omelia BlackwaterHeaden at [facility] in [place] and has been seeing Dr. Omelia BlackwaterHeaden for almost 10 years. He has a history of cutting his forearms with the last episode of cutting being about 10 years ago. He has a history of overdosing on sleeping pills in the past as well. He has numerous allergies to multiple psychotropic medications including Depakote, Paxil, Seroquel, and Zyprexa. He says he had a seizure after taking Zyprexa. He has also had trials of Risperdal, Lamictal, Prozac, Wellbutrin, and Klonopin. Current pharmacy is [facility].   SUBSTANCE ABUSE HISTORY: The patient says he rarely drinks alcohol and no history  of any heavy alcohol use. He denies any history of any cocaine, cannabis, opiate, or stimulant use. There is a history of abusing stacker pills but the patient denies any recent use in the past two weeks of stacker pills. He denies any tobacco use.   FAMILY PSYCHIATRIC HISTORY: The patient's father died of cancer per the patient but old records indicate that his father had committed suicide.    PAST MEDICAL HISTORY:  1. History of morbid obesity at 375 pounds in the past.  2. History of hypothyroidism.  3. History of hypertension.  4. He had one seizure after taking Zyprexa.  He denies any other major medical problems.   OUTPATIENT MEDICATIONS:  1. Invega Sustenna 234 mg every four weeks. He says his last injection was at the end of March.  2. Ativan 1 mg every 12 hours p.r.n. for anxiety. 3. Phenergan 25 mg twice a day p.r.n. for nausea and vomiting.  4. Propranolol 20 mg p.o. b.i.d. for akathisia and restlessness. 5. Loxapine 25 mg p.o. nightly.  6. Abilify 5 mg p.o. daily, started 03/01/2012.  7. The patient was started on Prozac 20 mg daily about a month ago but medication was discontinued on 03/06/2012, as his mother reported worsening depressive symptoms after starting the Prozac.   ALLERGIES: Depakote, Paxil, Seroquel, Zyprexa, penicillin.   SOCIAL HISTORY: The patient was born and raised in [place]. His parents separated when he was young. His father is currently deceased. He has one older brother. The patient lives with his mother and has never been married and has no children. He denies any history of any physical or sexual abuse. He tried to attend [employer] 2002 to 2003 as well 2006 to 2007, but was unable to complete a degree there. He has done four years at [employer] and has an Scientist, research (physical sciences) in science. He now just takes classes for enjoyment. He has never held any steady job and is on disability. The patient identifies himself as homosexual but says that he has never been in any serious relationship or even had sex with a male. He denies being in any serious relationships with females in the past.   LEGAL HISTORY: He denies any history of any arrests or incarcerations.   MENTAL STATUS EXAM:  Devin Allen is a pleasant 34 year old Caucasian male with a goatee and glasses. He was fully alert and oriented to time, place, and  situation. Speech was regular rate and rhythm, fluent and coherent. Mood was described as being "okay" but affect was depressed and anxious. Thought processes were linear, logical, and goal-directed. He did endorse current passive suicidal thoughts and stated that he had a plan to kill himself on his birthday. He denied any homicidal thoughts or psychotic symptoms including auditory or visual hallucinations. No paranoid thoughts or delusions. Attention and concentration were fairly good. Recall was three out of three initially and three out of three after five minutes. He did not have any difficulty spelling world backwards or doing serial sevens. He could name the presidents backwards to [place].  Judgment and insight appeared to be good by testing but poor by history. Abstraction was good.   SUICIDE RISK ASSESSMENT: At this time Devin Allen remains at a highly elevated risk of harm to self and others secondary to suicidal plan. He does have a history of impulsive behaviors in the past including cutting as well as a history of overdose as well.   REVIEW OF SYSTEMS: CONSTITUTIONAL: He denies any  weakness, fatigue, or weight changes. He denies any fever, chills, or night sweats. HEAD: He denies headaches or dizziness.  EYES: He denies any diplopia or blurred vision. ENT: He denies any hearing loss. RESPIRATORY: He denies any shortness of breath or cough. CARDIOVASCULAR: He denies chest pain or orthopnea. GASTROINTESTINAL: He denies any nausea, vomiting, or abdominal pain. He denies any change in bowel movements. GENITOURINARY: He denies incontinence or problems with frequency of urine. ENDOCRINE: He denies any heat or cold intolerance.  LYMPHATIC: He denies any anemia or easy bruising. MUSCULOSKELETAL: He denies any muscle or joint pain. NEUROLOGIC: He denies any tingling or weakness. PSYCHIATRIC: Please see history of present illness.   PHYSICAL EXAMINATION:  VITAL SIGNS: Blood pressure 149/99, heart rate  74, respirations 18, temperature 98.   HEENT: Normocephalic, atraumatic. Pupils equal, round, and reactive to light and accommodation. The patient was wearing glasses. Extraocular movements intact. Oral mucosa moist. No lesions noted.   NECK: Supple. No cervical lymphadenopathy or thyromegaly present.   LUNGS: Clear to auscultation bilaterally. No crackles, rales, or rhonchi.   CARDIAC: S1, S2 present. Regular rate and rhythm. No murmurs, rubs, or gallops.   ABDOMEN: Soft and normoactive bowel sounds present in all four quadrants. The patient did have some extra skin from weight loss. No tenderness noted. No masses noted.   EXTREMITIES: +2 pedal pulses bilaterally. No rashes, cyanosis, clubbing, or edema.   NEUROLOGIC: Cranial nerves II through XII were grossly intact. Gait was normal and steady. No hypo or hyperreflexia noted.   LABORATORY, DIAGNOSTIC, AND RADIOLOGICAL DATA: Toxicology screen negative for all substances. Ethanol level is not obtained. Sodium 141, potassium 3.7, chloride 108, CO2 26, BUN 15, creatinine 0.82, glucose 84. LFTs within normal limits. TSH within normal limits. White blood cell count 8.2, hemoglobin 13, platelet count 214. Urinalysis was nitrite and leukocyte esterase negative, no WBCs, no bacteria.   DIAGNOSES:  AXIS I: Schizoaffective disorder, bipolar type, most recent episode depressed, social anxiety disorder, stimulant abuse.   AXIS II: Borderline personality disorder.   AXIS III: History of hypothyroidism and history of hypertension.   AXIS IV: Mild to moderate, academic problems, difficulty with social anxiety interfering with school.   AXIS V: GAF at present equals 25.   ASSESSMENT AND TREATMENT RECOMMENDATIONS: Devin Allen is a 34 year old single Caucasian male with a history of schizoaffective disorder and borderline personality disorder who presented to the Emergency Room voluntarily at the referral of his outpatient psychiatrist secondary to  having suicidal thoughts with recurrent obsessions about a suicide plan and a suicide party on his birthday in June. He is denying any current psychotic symptoms at this time. We will admit to inpatient psychiatry for medication management, safety, and stabilization and place on suicide precautions and close observation.  1. Schizoaffective disorder and borderline personality disorder: We will plan to go ahead and restart the patient on outpatient psychotropic medications for now at Abilify 5 mg p.o. daily with a plan to increase to 10 mg p.o. daily for mood stabilization and depression, loxapine 25 mg p.o. nightly, and [place]. We will need to get the date of the last [place] injection. We will continue propranolol for akathisia and anxiety. We will continue Ambien as well for insomnia. We will get EKG to rule out QTc prolongation as well as B12 and folate level.  2. Borderline personality disorder: We will try to educate and help the patient to improve coping skills. He does have an outpatient therapist at [facility]  by the name of Corinna Gab. We will need to make sure the patient continues individual therapy after discharge.  3. History of hypothyroidism: TSH is within normal limits, stable.  4. History of hypertension: We will monitor vital signs while on the inpatient unit. The patient is on propranolol for akathisia.   DISPOSITION: The patient has a stable living situation with his mother. We will have his mother be involved in his treatment plan on the unit. Risks, benefits, and alternatives to treatment were discussed with the patient and he consented to the treatment plan.   ____________________________ Doralee Albino. Maryruth Bun, MD [id number] D: 03/07/2012 10:26:00 ET T: 03/07/2012 10:48:06 ET JOB#: [id number]  cc: Mikell Camp K. Maryruth Bun, MD, <Dictator> Darliss Ridgel MD ELECTRONICALLY SIGNED 03/08/2012 18:44

## 2018-05-27 ENCOUNTER — Ambulatory Visit: Payer: Medicare Other | Admitting: Nutrition

## 2020-03-15 ENCOUNTER — Other Ambulatory Visit (HOSPITAL_COMMUNITY)
Admission: RE | Admit: 2020-03-15 | Discharge: 2020-03-15 | Disposition: A | Discharge status: Home / Self Care | Payer: Medicare Other | Source: Ambulatory Visit | Attending: Oral Surgery | Admitting: Oral Surgery

## 2020-03-15 ENCOUNTER — Other Ambulatory Visit: Payer: Self-pay

## 2020-03-15 DIAGNOSIS — Z01812 Encounter for preprocedural laboratory examination: Secondary | ICD-10-CM | POA: Insufficient documentation

## 2020-03-15 DIAGNOSIS — Z20822 Contact with and (suspected) exposure to covid-19: Secondary | ICD-10-CM | POA: Diagnosis not present

## 2020-03-15 LAB — SARS CORONAVIRUS 2 (TAT 6-24 HRS): SARS Coronavirus 2: NEGATIVE

## 2020-03-15 NOTE — H&P (Signed)
HISTORY AND PHYSICAL  Devin Allen is a 39 y.o. male patient with CC: Painful teeth No diagnosis found.  Past Medical History:  Diagnosis Date  . Depression     No current facility-administered medications for this encounter.   Current Outpatient Medications  Medication Sig Dispense Refill  . buPROPion (WELLBUTRIN XL) 300 MG 24 hr tablet Take 300 mg by mouth at bedtime.    . diphenhydrAMINE (BENADRYL) 25 MG tablet Take 25 mg by mouth at bedtime as needed for sleep.    [name] TRINZA 819 MG/2.625ML SUSY Take 819 mg by mouth every 3 (three) months.    [name] lisinopril (ZESTRIL) 10 MG tablet Take 10 mg by mouth daily.    [name] LORazepam (ATIVAN) 0.5 MG tablet Take 0.5 mg by mouth 4 (four) times daily as needed for anxiety.     . Omega-3 Fatty Acids (FISH OIL) 1000 MG CAPS Take 1,000 mg by mouth daily.     Allergies  Allergen Reactions  . Penicillins     childhood   Active Problems:   * No active hospital problems. *  Vitals: There were no vitals taken for this visit. Lab results:No results found for this or any previous visit (from the past 24 hour(s)). Radiology Results: No results found.  Exam: BMI 42.   Multiple carious teeth. Edematous lower gingiva with calculus.No purulence, edema, fluctuance, trismus. Oral cancer screening negative. Pharynx clear. No lymphadenopathy.  Panorex: Carious lesions # 4, 8, 9, 14, 21, 27, 28, 29, 30,   Assessment:  ASA 3. 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 As a result, the best prosthetic plan is full uppe denture and full lower dentures.              Plan: [facility]. [place] day surgery. insert immediate denures.      Devin Allen 03/15/2020

## 2020-03-16 ENCOUNTER — Other Ambulatory Visit: Payer: Self-pay

## 2020-03-16 ENCOUNTER — Encounter (HOSPITAL_COMMUNITY): Payer: Self-pay | Admitting: Oral Surgery

## 2020-03-17 ENCOUNTER — Ambulatory Visit (HOSPITAL_COMMUNITY): Payer: Medicare Other | Admitting: Anesthesiology

## 2020-03-17 ENCOUNTER — Other Ambulatory Visit: Payer: Self-pay

## 2020-03-17 ENCOUNTER — Ambulatory Visit (HOSPITAL_COMMUNITY)
Admission: RE | Admit: 2020-03-17 | Discharge: 2020-03-17 | Disposition: A | Discharge status: Home / Self Care | Payer: Medicare Other | Attending: Oral Surgery | Admitting: Oral Surgery

## 2020-03-17 ENCOUNTER — Encounter (HOSPITAL_COMMUNITY)
Admission: RE | Disposition: A | Discharge status: Home / Self Care | Payer: Self-pay | Source: Home / Self Care | Attending: Oral Surgery

## 2020-03-17 DIAGNOSIS — F329 Major depressive disorder, single episode, unspecified: Secondary | ICD-10-CM | POA: Diagnosis not present

## 2020-03-17 DIAGNOSIS — K056 Periodontal disease, unspecified: Secondary | ICD-10-CM | POA: Diagnosis not present

## 2020-03-17 DIAGNOSIS — Z6841 Body Mass Index (BMI) 40.0 and over, adult: Secondary | ICD-10-CM | POA: Insufficient documentation

## 2020-03-17 DIAGNOSIS — I10 Essential (primary) hypertension: Secondary | ICD-10-CM | POA: Insufficient documentation

## 2020-03-17 DIAGNOSIS — Z79899 Other long term (current) drug therapy: Secondary | ICD-10-CM | POA: Diagnosis not present

## 2020-03-17 DIAGNOSIS — F209 Schizophrenia, unspecified: Secondary | ICD-10-CM | POA: Insufficient documentation

## 2020-03-17 DIAGNOSIS — F419 Anxiety disorder, unspecified: Secondary | ICD-10-CM | POA: Diagnosis not present

## 2020-03-17 DIAGNOSIS — K029 Dental caries, unspecified: Secondary | ICD-10-CM | POA: Insufficient documentation

## 2020-03-17 HISTORY — PX: TOOTH EXTRACTION: SHX859

## 2020-03-17 HISTORY — DX: Schizophrenia, unspecified: F20.9

## 2020-03-17 HISTORY — DX: Anxiety disorder, unspecified: F41.9

## 2020-03-17 HISTORY — DX: Panic disorder (episodic paroxysmal anxiety): F41.0

## 2020-03-17 HISTORY — DX: Bipolar disorder, unspecified: F31.9

## 2020-03-17 HISTORY — DX: Personal history of urinary calculi: Z87.442

## 2020-03-17 HISTORY — DX: Essential (primary) hypertension: I10

## 2020-03-17 LAB — CBC
HCT: 39.7 % (ref 39.0–52.0)
Hemoglobin: 13.2 g/dL (ref 13.0–17.0)
MCH: 30.4 pg (ref 26.0–34.0)
MCHC: 33.2 g/dL (ref 30.0–36.0)
MCV: 91.5 fL (ref 80.0–100.0)
Platelets: 257 10*3/uL (ref 150–400)
RBC: 4.34 MIL/uL (ref 4.22–5.81)
RDW: 12.1 % (ref 11.5–15.5)
WBC: 5.9 10*3/uL (ref 4.0–10.5)
nRBC: 0 % (ref 0.0–0.2)

## 2020-03-17 LAB — BASIC METABOLIC PANEL
Anion gap: 12 (ref 5–15)
BUN: 15 mg/dL (ref 6–20)
CO2: 22 mmol/L (ref 22–32)
Calcium: 9.2 mg/dL (ref 8.9–10.3)
Chloride: 105 mmol/L (ref 98–111)
Creatinine, Ser: 1.05 mg/dL (ref 0.61–1.24)
GFR calc Af Amer: >60 mL/min (ref >60)
GFR calc non Af Amer: >60 mL/min (ref >60)
Glucose, Bld: 91 mg/dL (ref 70–99)
Potassium: 4 mmol/L (ref 3.5–5.1)
Sodium: 139 mmol/L (ref 135–145)

## 2020-03-17 SURGERY — DENTAL RESTORATION/EXTRACTIONS
Anesthesia: General | Site: Mouth

## 2020-03-17 MED ORDER — LIDOCAINE-EPINEPHRINE 2 %-1:100000 IJ SOLN
INTRAMUSCULAR | Status: AC
  Filled 2020-03-17: qty 1

## 2020-03-17 MED ORDER — MIDAZOLAM HCL 2 MG/2ML IJ SOLN
INTRAMUSCULAR | Status: DC | PRN
  Administered 2020-03-17: 2 mg via INTRAVENOUS

## 2020-03-17 MED ORDER — SUCCINYLCHOLINE CHLORIDE 200 MG/10ML IV SOSY
PREFILLED_SYRINGE | INTRAVENOUS | Status: DC | PRN
  Administered 2020-03-17: 140 mg via INTRAVENOUS

## 2020-03-17 MED ORDER — OXYMETAZOLINE HCL 0.05 % NA SOLN
NASAL | Status: AC
  Filled 2020-03-17: qty 30

## 2020-03-17 MED ORDER — LIDOCAINE-EPINEPHRINE 2 %-1:100000 IJ SOLN
INTRAMUSCULAR | Status: DC | PRN
  Administered 2020-03-17: 20 mL via INTRADERMAL

## 2020-03-17 MED ORDER — PROPOFOL 10 MG/ML IV BOLUS
INTRAVENOUS | Status: DC | PRN
  Administered 2020-03-17: 200 mg via INTRAVENOUS

## 2020-03-17 MED ORDER — FENTANYL CITRATE (PF) 100 MCG/2ML IJ SOLN
INTRAMUSCULAR | Status: DC | PRN
  Administered 2020-03-17: 100 ug via INTRAVENOUS

## 2020-03-17 MED ORDER — DEXAMETHASONE SODIUM PHOSPHATE 10 MG/ML IJ SOLN
INTRAMUSCULAR | Status: DC | PRN
  Administered 2020-03-17: 8 mg via INTRAVENOUS

## 2020-03-17 MED ORDER — SUGAMMADEX SODIUM 200 MG/2ML IV SOLN
INTRAVENOUS | Status: DC | PRN
  Administered 2020-03-17: 200 mg via INTRAVENOUS
  Administered 2020-03-17: 100 mg via INTRAVENOUS

## 2020-03-17 MED ORDER — FENTANYL CITRATE (PF) 250 MCG/5ML IJ SOLN
INTRAMUSCULAR | Status: AC
  Filled 2020-03-17: qty 5

## 2020-03-17 MED ORDER — MIDAZOLAM HCL 2 MG/2ML IJ SOLN
INTRAMUSCULAR | Status: AC
  Filled 2020-03-17: qty 2

## 2020-03-17 MED ORDER — DEXMEDETOMIDINE HCL IN NACL 200 MCG/50ML IV SOLN
INTRAVENOUS | Status: DC | PRN
  Administered 2020-03-17 (×2): 12 ug via INTRAVENOUS
  Administered 2020-03-17: 4 ug via INTRAVENOUS
  Administered 2020-03-17: 12 ug via INTRAVENOUS

## 2020-03-17 MED ORDER — OXYCODONE-ACETAMINOPHEN 5-325 MG PO TABS: 1.0000 | ORAL_TABLET | ORAL | 0 refills | Status: DC | PRN

## 2020-03-17 MED ORDER — CLINDAMYCIN PHOSPHATE 600 MG/50ML IV SOLN
INTRAVENOUS | Status: AC
  Filled 2020-03-17: qty 50

## 2020-03-17 MED ORDER — OXYMETAZOLINE HCL 0.05 % NA SOLN
NASAL | Status: DC | PRN
  Administered 2020-03-17: 2 via NASAL

## 2020-03-17 MED ORDER — ONDANSETRON HCL 4 MG/2ML IJ SOLN
INTRAMUSCULAR | Status: DC | PRN
  Administered 2020-03-17: 4 mg via INTRAVENOUS

## 2020-03-17 MED ORDER — CLINDAMYCIN HCL 300 MG PO CAPS
300.0000 mg | ORAL_CAPSULE | Freq: Three times a day (TID) | ORAL | 0 refills | Status: DC
Start: 2020-03-17 — End: 2023-10-09

## 2020-03-17 MED ORDER — LACTATED RINGERS IV SOLN: INTRAVENOUS | Status: DC

## 2020-03-17 MED ORDER — ROCURONIUM BROMIDE 10 MG/ML (PF) SYRINGE
PREFILLED_SYRINGE | INTRAVENOUS | Status: DC | PRN
  Administered 2020-03-17: 40 mg via INTRAVENOUS

## 2020-03-17 MED ORDER — PROPOFOL 10 MG/ML IV BOLUS
INTRAVENOUS | Status: AC
  Filled 2020-03-17: qty 20

## 2020-03-17 MED ORDER — CLINDAMYCIN PHOSPHATE 600 MG/50ML IV SOLN
600.0000 mg | INTRAVENOUS | Status: AC
  Administered 2020-03-17: 600 mg via INTRAVENOUS

## 2020-03-17 MED ORDER — SODIUM CHLORIDE 0.9 % IR SOLN
Status: DC | PRN
  Administered 2020-03-17: 1000 mL

## 2020-03-17 MED ORDER — LIDOCAINE 2% (20 MG/ML) 5 ML SYRINGE
INTRAMUSCULAR | Status: DC | PRN
  Administered 2020-03-17: 50 mg via INTRAVENOUS

## 2020-03-17 MED ORDER — 0.9 % SODIUM CHLORIDE (POUR BTL) OPTIME
TOPICAL | Status: DC | PRN
  Administered 2020-03-17: 1000 mL

## 2020-03-17 SURGICAL SUPPLY — 40 items
BLADE SURG 15 STRL LF DISP TIS (BLADE) ×1 IMPLANT
BLADE SURG 15 STRL SS (BLADE) ×3
BUR CROSS CUT FISSURE 1.6 (BURR) ×2 IMPLANT
BUR CROSS CUT FISSURE 1.6MM (BURR) ×1
BUR EGG ELITE 4.0 (BURR) ×2 IMPLANT
BUR EGG ELITE 4.0MM (BURR) ×1
CANISTER SUCT 3000ML PPV (MISCELLANEOUS) ×3 IMPLANT
COVER SURGICAL LIGHT HANDLE (MISCELLANEOUS) ×3 IMPLANT
COVER WAND RF STERILE (DRAPES) ×3 IMPLANT
DECANTER SPIKE VIAL GLASS SM (MISCELLANEOUS) ×3 IMPLANT
DRAPE U-SHAPE 76X120 STRL (DRAPES) ×3 IMPLANT
GAUZE PACKING FOLDED 2  STR (GAUZE/BANDAGES/DRESSINGS) ×3
GAUZE PACKING FOLDED 2 STR (GAUZE/BANDAGES/DRESSINGS) ×1 IMPLANT
GLOVE BIO SURGEON STRL SZ 6.5 (GLOVE) IMPLANT
GLOVE BIO SURGEON STRL SZ7 (GLOVE) IMPLANT
GLOVE BIO SURGEON STRL SZ7.5 (GLOVE) ×3 IMPLANT
GLOVE BIO SURGEONS STRL SZ 6.5 (GLOVE)
GLOVE BIOGEL PI IND STRL 6.5 (GLOVE) IMPLANT
GLOVE BIOGEL PI IND STRL 7.0 (GLOVE) IMPLANT
GLOVE BIOGEL PI INDICATOR 6.5 (GLOVE)
GLOVE BIOGEL PI INDICATOR 7.0 (GLOVE)
GOWN STRL REUS W/ TWL LRG LVL3 (GOWN DISPOSABLE) ×1 IMPLANT
GOWN STRL REUS W/ TWL XL LVL3 (GOWN DISPOSABLE) ×1 IMPLANT
GOWN STRL REUS W/TWL LRG LVL3 (GOWN DISPOSABLE) ×3
GOWN STRL REUS W/TWL XL LVL3 (GOWN DISPOSABLE) ×3
IV NS 1000ML (IV SOLUTION) ×3
IV NS 1000ML BAXH (IV SOLUTION) ×1 IMPLANT
KIT BASIN OR (CUSTOM PROCEDURE TRAY) ×3 IMPLANT
KIT TURNOVER KIT B (KITS) ×3 IMPLANT
NDL HYPO 25GX1X1/2 BEV (NEEDLE) ×2 IMPLANT
NEEDLE HYPO 25GX1X1/2 BEV (NEEDLE) ×6 IMPLANT
NS IRRIG 1000ML POUR BTL (IV SOLUTION) ×3 IMPLANT
PAD ARMBOARD 7.5X6 YLW CONV (MISCELLANEOUS) ×3 IMPLANT
SLEEVE IRRIGATION ELITE 7 (MISCELLANEOUS) ×3 IMPLANT
SPONGE SURGIFOAM ABS GEL 12-7 (HEMOSTASIS) IMPLANT
SUT CHROMIC 3 0 PS 2 (SUTURE) ×3 IMPLANT
SYR CONTROL 10ML LL (SYRINGE) ×3 IMPLANT
TRAY ENT MC OR (CUSTOM PROCEDURE TRAY) ×3 IMPLANT
TUBING IRRIGATION (MISCELLANEOUS) ×3 IMPLANT
YANKAUER SUCT BULB TIP NO VENT (SUCTIONS) ×3 IMPLANT

## 2020-03-17 NOTE — Anesthesia Preprocedure Evaluation (Addendum)
Anesthesia Evaluation  Patient identified by MRN, date of birth, ID band Patient awake    Reviewed: Allergy & Precautions, H&P , NPO status , Patient's Chart, lab work & pertinent test results  Airway Mallampati: II   Neck ROM: full    Dental  (+) Dental Advisory Given, Teeth Intact, Chipped,    Pulmonary    breath sounds clear to auscultation       Cardiovascular hypertension,  Rhythm:regular Rate:Normal     Neuro/Psych Seizures -,  PSYCHIATRIC DISORDERS Anxiety Depression Bipolar Disorder Schizophrenia    GI/Hepatic   Endo/Other  Morbid obesity  Renal/GU stones     Musculoskeletal   Abdominal   Peds  Hematology   Anesthesia Other Findings   Reproductive/Obstetrics                            Anesthesia Physical Anesthesia Plan  ASA: III  Anesthesia Plan: General   Post-op Pain Management:    Induction: Intravenous  PONV Risk Score and Plan: 2 and Ondansetron, Dexamethasone, Midazolam and Treatment may vary due to age or medical condition  Airway Management Planned: Nasal ETT  Additional Equipment:   Intra-op Plan:   Post-operative Plan: Extubation in OR  Informed Consent: I have reviewed the patients History and Physical, chart, labs and discussed the procedure including the risks, benefits and alternatives for the proposed anesthesia with the patient or authorized representative who has indicated his/her understanding and acceptance.       Plan Discussed with: CRNA, Anesthesiologist and Surgeon  Anesthesia Plan Comments:         Anesthesia Quick Evaluation

## 2020-03-17 NOTE — Op Note (Signed)
NAMETONYA, CARLILE MEDICAL RECORD TI:[id number] ACCOUNT [id number] DATE OF BIRTH:Dec 02, 1980 FACILITY: [facility] LOCATION: [facility] PHYSICIAN:Varick Keys M. Hugh Garrow, DDS  OPERATIVE REPORT  DATE OF PROCEDURE:  03/17/2020  PREOPERATIVE DIAGNOSIS:  Nonrestorable teeth secondary to dental caries and severe periodontitis numbers 3, 4, 6, 7, 8, 9, 10, 12, 13, 14, 18, 20, 21, 22, 23, 24, 25, 26, 27, 28, 29, 30.  POSTOPERATIVE DIAGNOSIS:  Nonrestorable teeth secondary to dental caries and severe periodontitis numbers 3, 4, 6, 7, 8, 9, 10, 12, 13, 14, 18, 20, 21, 22, 23, 24, 25, 26, 27, 28, 29, 30.  PROCEDURES:   1.  Extraction of teeth numbers 3, 4, 6, 7, 8, 9, 10, 12, 13, 14, 18, 20, 21, 22, 23, 24, 25, 26, 27, 28, 29, 30.   2.  Alveoplasty right and left maxilla and mandible.  SURGEON:  Diona Browner, DDS  ANESTHESIA:  General, Dr. Marcie Bal attending, nasal intubation.  DESCRIPTION PROCEDURE:  The patient was taken to the operating room and placed on the table in the supine position.  General anesthesia was administered intravenously and a nasal endotracheal tube was placed and secured.  The eyes were protected.  The  patient was draped for surgery.  A timeout was performed.  The posterior pharynx was suctioned and a throat pack was placed.  Two percent lidocaine 1:100,000 epinephrine was infiltrated in an inferior alveolar block on the right and left sides and then  buccal and palatal infiltration in the maxilla.  A bite block was placed on the right side of the mouth and a sweetheart retractor was used to retract the tongue.  A #15 blade used to make an incision beginning approximately 1 cm proximal to tooth #18 on  the alveolar crest and carried forward in the buccal and lingual sulcus around teeth numbers 20, 21, 22, 23, 24, 25 and 26.  The periosteum was reflected.  The teeth were elevated with a 301 elevator and the teeth were removed from the mouth with the  dental forceps.  The sockets were  curetted.  The periosteum was reflected to expose the alveolar crest and then alveoplasty was performed using the egg-shaped bur and rongeur.  Then, the areas were irrigated after smoothing with a bone file and closed  with 3-0 chromic.  Then, the left maxilla was operated.  The 15 blade was used to make an incision in the gingival sulcus buccally and palatally around teeth numbers 14, 13, 12, 10, 9, 8, 7.  The periosteum was reflected from around these teeth.  The  teeth were elevated with a 301 elevator and removed from the mouth with the dental forceps and the sockets were curetted.  The periosteum was reflected to expose the alveolar crest and alveoplasty was performed using the egg bur followed by the bone  file.  Then, the areas were irrigated and closed with 3-0 chromic.  Then, the bite block and sweetheart retractor were repositioned to the other side of the mouth.  A 15 blade was used to make an incision around teeth numbers 27, 28, 29, 30 in the  mandible and around teeth numbers 3, 4 and 6 in the maxilla.  The periosteum was reflected from around these teeth.  The teeth were elevated and removed with the dental forceps, with the exception of tooth #30.  This tooth required sectioning prior to  removal because I was unable to grasp the tooth with forceps due to severe decay.  Bone was removed circumferentially around the roots and  the tooth was sectioned and removed with a 301 elevator.  Then, the sockets were curetted and then the periosteum  was reflected to expose the alveolar crest and alveoplasty was performed in the right maxilla and right mandible using the egg bur, followed by the bone file.  The area was irrigated and closed with 3-0 chromic.  The oral cavity was then irrigated and  suctioned.  Throat pack was removed.  The patient was left in care of anesthesia for extubation with plans for transfer to recovery room and discharge home through day surgery.  ESTIMATED BLOOD LOSS:   Minimal.  SPECIMENS:  None.  VN/NUANCE  D:03/17/2020 T:03/17/2020 JOB:[id number]

## 2020-03-17 NOTE — Transfer of Care (Signed)
Immediate Anesthesia Transfer of Care Note  Patient: Devin Allen  Procedure(s) Performed: DENTAL RESTORATION/EXTRACTIONS (N/A Mouth)  Patient Location: PACU  Anesthesia Type:General  Level of Consciousness: awake, alert  and oriented  Airway & Oxygen Therapy: Patient Spontanous Breathing and Patient connected to face mask oxygen  Post-op Assessment: Report given to RN, Post -op Vital signs reviewed and stable and Patient moving all extremities  Post vital signs: Reviewed and stable  Last Vitals:  Vitals Value Taken Time  BP 165/113 03/17/20 1127  Temp    Pulse 71 03/17/20 1128  Resp 21 03/17/20 1128  SpO2 95 % 03/17/20 1128  Vitals shown include unvalidated device data.  Last Pain:  Vitals:   03/17/20 0801  TempSrc:   PainSc: 0-No pain         Complications: No apparent anesthesia complications

## 2020-03-17 NOTE — H&P (Signed)
H&P documentation  -History and Physical Reviewed  -Patient has been re-examined  -No change in the plan of care  Devin Allen  

## 2020-03-17 NOTE — Op Note (Signed)
03/17/2020  11:01 AM  PATIENT:  Devin Allen  39 y.o. male  PRE-OPERATIVE DIAGNOSIS:  NON RESTORABLE TEETH #3, 4, 6, 7, 8, 9, 10, 12, 13, 14, 18, 20, 21, 22, 23, 24, 25, 26, 27, 28, 29, 30  POST-OPERATIVE DIAGNOSIS:  SAME  PROCEDURE:  Procedure(s): DENTAL EXTRACTIONS TEETH #3, 4, 6, 7, 8, 9, 10, 12, 13, 14, 18, 20, 21, 22, 23, 24, 25, 26, 27, 28, 29, 30;ALVEOLOPLASTY  RIGHT AND LEFT MAXILLA AND MANDIBLE  SURGEON:  Surgeon(s): [name], DDS  ANESTHESIA:   local and general  EBL:  minimal  DRAINS: none   SPECIMEN:  No Specimen  COUNTS:  YES  PLAN OF CARE: Discharge to home after PACU  PATIENT DISPOSITION:  PACU - hemodynamically stable.   PROCEDURE DETAILS: Dictation # [id number]  Georgia Lopes, DMD 03/17/2020 11:01 AM

## 2020-03-17 NOTE — Anesthesia Procedure Notes (Signed)
Procedure Name: Intubation Date/Time: 03/17/2020 10:10 AM Performed by: Leonor Liv, CRNA Pre-anesthesia Checklist: Patient identified, Emergency Drugs available, Suction available and Patient being monitored Patient Re-evaluated:Patient Re-evaluated prior to induction Oxygen Delivery Method: Circle System Utilized Preoxygenation: Pre-oxygenation with 100% oxygen Induction Type: IV induction Ventilation: Mask ventilation without difficulty Laryngoscope Size: Mac and 4 Grade View: Grade I Nasal Tubes: Left, Nasal Rae and Nasal prep performed Number of attempts: 1 Airway Equipment and Method: Oral airway Placement Confirmation: ETT inserted through vocal cords under direct vision,  positive ETCO2 and breath sounds checked- equal and bilateral Secured at: 29 cm Tube secured with: Tape Dental Injury: Teeth and Oropharynx as per pre-operative assessment

## 2020-03-18 ENCOUNTER — Encounter: Payer: Self-pay | Admitting: *Deleted

## 2020-03-18 NOTE — Anesthesia Postprocedure Evaluation (Signed)
Anesthesia Post Note  Patient: Devin Allen  Procedure(s) Performed: DENTAL RESTORATION/EXTRACTIONS (N/A Mouth)     Patient location during evaluation: PACU Anesthesia Type: General Level of consciousness: awake and alert Pain management: pain level controlled Vital Signs Assessment: post-procedure vital signs reviewed and stable Respiratory status: spontaneous breathing, nonlabored ventilation, respiratory function stable and patient connected to nasal cannula oxygen Cardiovascular status: blood pressure returned to baseline and stable Postop Assessment: no apparent nausea or vomiting Anesthetic complications: no    Last Vitals:  Vitals:   03/17/20 1135 03/17/20 1150  BP: 132/84 (!) 135/91  Pulse: 68 67  Resp: 19 18  Temp:  36.6 C  SpO2: 98% 96%    Last Pain:  Vitals:   03/17/20 1150  TempSrc:   PainSc: 0-No pain                 Marlina Cataldi S

## 2023-08-17 ENCOUNTER — Emergency Department (HOSPITAL_COMMUNITY): Payer: Medicare HMO

## 2023-08-17 ENCOUNTER — Encounter (HOSPITAL_COMMUNITY): Payer: Self-pay | Admitting: Emergency Medicine

## 2023-08-17 ENCOUNTER — Ambulatory Visit (HOSPITAL_COMMUNITY)
Admission: EM | Admit: 2023-08-17 | Discharge: 2023-08-17 | Disposition: A | Discharge status: Home / Self Care | Payer: Medicare HMO | Attending: Emergency Medicine | Admitting: Emergency Medicine

## 2023-08-17 ENCOUNTER — Encounter (HOSPITAL_COMMUNITY)
Admission: EM | Disposition: A | Discharge status: Home / Self Care | Payer: Self-pay | Source: Home / Self Care | Attending: Emergency Medicine

## 2023-08-17 ENCOUNTER — Emergency Department (EMERGENCY_DEPARTMENT_HOSPITAL): Payer: Medicare HMO | Admitting: Anesthesiology

## 2023-08-17 ENCOUNTER — Other Ambulatory Visit: Payer: Self-pay

## 2023-08-17 ENCOUNTER — Emergency Department (HOSPITAL_COMMUNITY): Payer: Medicare HMO | Admitting: Anesthesiology

## 2023-08-17 DIAGNOSIS — K200 Eosinophilic esophagitis: Secondary | ICD-10-CM | POA: Diagnosis not present

## 2023-08-17 DIAGNOSIS — Z6841 Body Mass Index (BMI) 40.0 and over, adult: Secondary | ICD-10-CM | POA: Insufficient documentation

## 2023-08-17 DIAGNOSIS — T18128A Food in esophagus causing other injury, initial encounter: Secondary | ICD-10-CM | POA: Insufficient documentation

## 2023-08-17 DIAGNOSIS — F259 Schizoaffective disorder, unspecified: Secondary | ICD-10-CM | POA: Diagnosis not present

## 2023-08-17 DIAGNOSIS — Z79899 Other long term (current) drug therapy: Secondary | ICD-10-CM | POA: Insufficient documentation

## 2023-08-17 DIAGNOSIS — I10 Essential (primary) hypertension: Secondary | ICD-10-CM | POA: Insufficient documentation

## 2023-08-17 DIAGNOSIS — W44F3XA Food entering into or through a natural orifice, initial encounter: Secondary | ICD-10-CM | POA: Insufficient documentation

## 2023-08-17 DIAGNOSIS — R1310 Dysphagia, unspecified: Secondary | ICD-10-CM | POA: Diagnosis not present

## 2023-08-17 HISTORY — PX: FOREIGN BODY REMOVAL ESOPHAGEAL: SHX5322

## 2023-08-17 HISTORY — PX: ESOPHAGOGASTRODUODENOSCOPY (EGD) WITH PROPOFOL: SHX5813

## 2023-08-17 HISTORY — PX: BIOPSY: SHX5522

## 2023-08-17 LAB — CBC
HCT: 37.5 % — ABNORMAL LOW (ref 39.0–52.0)
Hemoglobin: 12.8 g/dL — ABNORMAL LOW (ref 13.0–17.0)
MCH: 30.5 pg (ref 26.0–34.0)
MCHC: 34.1 g/dL (ref 30.0–36.0)
MCV: 89.3 fL (ref 80.0–100.0)
Platelets: 246 10*3/uL (ref 150–400)
RBC: 4.2 MIL/uL — ABNORMAL LOW (ref 4.22–5.81)
RDW: 12.4 % (ref 11.5–15.5)
WBC: 6.4 10*3/uL (ref 4.0–10.5)
nRBC: 0 % (ref 0.0–0.2)

## 2023-08-17 SURGERY — ESOPHAGOGASTRODUODENOSCOPY (EGD) WITH PROPOFOL
Anesthesia: General

## 2023-08-17 MED ORDER — SODIUM CHLORIDE 0.9 % IV SOLN: INTRAVENOUS | Status: DC

## 2023-08-17 MED ORDER — OMEPRAZOLE 20 MG PO CPDR
20.0000 mg | DELAYED_RELEASE_CAPSULE | Freq: Two times a day (BID) | ORAL | 1 refills | Status: DC
Start: 2023-08-17 — End: 2023-10-09

## 2023-08-17 MED ORDER — ONDANSETRON HCL 4 MG/2ML IJ SOLN
4.0000 mg | Freq: Once | INTRAMUSCULAR | Status: AC
  Administered 2023-08-17: 4 mg via INTRAVENOUS
  Filled 2023-08-17: qty 2

## 2023-08-17 MED ORDER — ALUM & MAG HYDROXIDE-SIMETH 200-200-20 MG/5ML PO SUSP
30.0000 mL | Freq: Once | ORAL | Status: DC
  Filled 2023-08-17: qty 30

## 2023-08-17 MED ORDER — SUCCINYLCHOLINE CHLORIDE 200 MG/10ML IV SOSY
PREFILLED_SYRINGE | INTRAVENOUS | Status: DC | PRN
  Administered 2023-08-17: 140 mg via INTRAVENOUS

## 2023-08-17 MED ORDER — LACTATED RINGERS IV SOLN: INTRAVENOUS | Status: DC

## 2023-08-17 MED ORDER — EPHEDRINE SULFATE (PRESSORS) 50 MG/ML IJ SOLN
INTRAMUSCULAR | Status: DC | PRN
Start: 2023-08-17 — End: 2023-08-17
  Administered 2023-08-17: 10 mg via INTRAVENOUS

## 2023-08-17 MED ORDER — PROPOFOL 10 MG/ML IV BOLUS
INTRAVENOUS | Status: DC | PRN
  Administered 2023-08-17: 200 mg via INTRAVENOUS

## 2023-08-17 MED ORDER — EPHEDRINE 5 MG/ML INJ
INTRAVENOUS | Status: AC
  Filled 2023-08-17: qty 5

## 2023-08-17 MED ORDER — FENTANYL CITRATE (PF) 100 MCG/2ML IJ SOLN
INTRAMUSCULAR | Status: AC
  Filled 2023-08-17: qty 2

## 2023-08-17 MED ORDER — DEXAMETHASONE SODIUM PHOSPHATE 10 MG/ML IJ SOLN
INTRAMUSCULAR | Status: DC | PRN
  Administered 2023-08-17: 10 mg via INTRAVENOUS

## 2023-08-17 MED ORDER — LIDOCAINE HCL (CARDIAC) PF 100 MG/5ML IV SOSY
PREFILLED_SYRINGE | INTRAVENOUS | Status: DC | PRN
  Administered 2023-08-17: 50 mg via INTRAVENOUS

## 2023-08-17 MED ORDER — GLUCAGON HCL RDNA (DIAGNOSTIC) 1 MG IJ SOLR
1.0000 mg | Freq: Once | INTRAMUSCULAR | Status: AC
  Administered 2023-08-17: 1 mg via INTRAVENOUS
  Filled 2023-08-17: qty 1

## 2023-08-17 MED ORDER — ONDANSETRON HCL 4 MG/2ML IJ SOLN
INTRAMUSCULAR | Status: DC | PRN
Start: 2023-08-17 — End: 2023-08-17
  Administered 2023-08-17: 4 mg via INTRAVENOUS

## 2023-08-17 NOTE — Consult Note (Signed)
Vista Lawman, M.D. Gastroenterology & Hepatology                                           Patient Name: Devin Allen Account #: @FLAACCTNO @   MRN: [id number] Admission Date: 08/17/2023 Date of Evaluation:  08/17/2023 Time of Evaluation: 8:46 AM   Chief Complaint:  Food impaction   HPI:  This is a 42 year old male with history of Schizoaffective disorder , who comes to the hospital for evaluation after presenting an episode of food impaction. The patient reports he was eating Chicken at 5 PM last and he felt the food got stuck in the retrosternal area. He has a sense of something stuck at the back of the throat . In ED although he is not distressed , he was able to swallow water but came back up after few minutes   Patient reports similar presentation twice so far , 8 years and 5 years ago . No prior endoscopies  Drooling:None Able to swallow food:No Able to drink liquids"Yes, but unable to keep it down Previous reflux symptoms:No Weight changes:No Seasonal allegies: No  Previous episodes: None  Previous WGN:FAOZ Previous colonoscopy:None   In the ER, his vital signs were WNL. He was protecting his airway. Labs were notable for 12.8. He underwent the following imaging CXR and Neck soft tissue xray WNL .   Past Medical History: SEE CHRONIC ISSSUES: Past Medical History:  Diagnosis Date   Anxiety    Bipolar disorder (HCC)    Depression    History of kidney stones    passed   Hypertension    Panic attack    Schizophrenia (HCC)    Seizure (HCC) 2013   from zyprexa   Past Surgical History:  Past Surgical History:  Procedure Laterality Date   HERNIA REPAIR     age 37   TOOTH EXTRACTION N/A 03/17/2020   Procedure: DENTAL RESTORATION/EXTRACTIONS;  Surgeon: [name], DDS;  Location: [facility] OR;  Service: Oral Surgery;  Laterality: N/A;   Family History: History reviewed. No pertinent family history. Social History:  Social History   Tobacco Use   Smoking  status: Never   Smokeless tobacco: Never  Vaping Use   Vaping status: Never Used  Substance Use Topics   Alcohol use: No   Drug use: No    Home Medications:  Prior to Admission medications   Medication Sig Start Date End Date Taking? Authorizing Provider  buPROPion (WELLBUTRIN XL) 300 MG 24 hr tablet Take 300 mg by mouth at bedtime.    [provider]  clindamycin (CLEOCIN) 300 MG capsule Take 1 capsule (300 mg total) by mouth 3 (three) times daily. 03/17/20   [name], DMD  diphenhydrAMINE (BENADRYL) 25 MG tablet Take 25 mg by mouth at bedtime as needed for sleep.    [provider]  INVEGA TRINZA 819 MG/2.625ML SUSY Take 819 mg by mouth every 3 (three) months. 12/15/19   [provider]  lisinopril (ZESTRIL) 10 MG tablet Take 10 mg by mouth daily. 02/05/20   [provider]  LORazepam (ATIVAN) 0.5 MG tablet Take 0.5 mg by mouth 4 (four) times daily as needed for anxiety.     [provider]  Omega-3 Fatty Acids (FISH OIL) 1000 MG CAPS Take 1,000 mg by mouth daily.    [provider]  oxyCODONE-acetaminophen (PERCOCET) 5-325 MG  tablet Take 1 tablet by mouth every 4 (four) hours as needed. 03/17/20   [name], DMD    Inpatient Medications:  Current Facility-Administered Medications:    glucagon (human recombinant) (GLUCAGEN) injection 1 mg, 1 mg, Intravenous, Once, Nanavati, Ankit, MD   ondansetron (ZOFRAN) injection 4 mg, 4 mg, Intravenous, Once, Nanavati, Ankit, MD  Current Outpatient Medications:    buPROPion (WELLBUTRIN XL) 300 MG 24 hr tablet, Take 300 mg by mouth at bedtime., Disp: , Rfl:    clindamycin (CLEOCIN) 300 MG capsule, Take 1 capsule (300 mg total) by mouth 3 (three) times daily., Disp: 21 capsule, Rfl: 0   diphenhydrAMINE (BENADRYL) 25 MG tablet, Take 25 mg by mouth at bedtime as needed for sleep., Disp: , Rfl:    INVEGA TRINZA 819 MG/2.625ML SUSY, Take 819 mg by mouth every 3 (three) months., Disp: , Rfl:     lisinopril (ZESTRIL) 10 MG tablet, Take 10 mg by mouth daily., Disp: , Rfl:    LORazepam (ATIVAN) 0.5 MG tablet, Take 0.5 mg by mouth 4 (four) times daily as needed for anxiety. , Disp: , Rfl:    Omega-3 Fatty Acids (FISH OIL) 1000 MG CAPS, Take 1,000 mg by mouth daily., Disp: , Rfl:    oxyCODONE-acetaminophen (PERCOCET) 5-325 MG tablet, Take 1 tablet by mouth every 4 (four) hours as needed., Disp: 30 tablet, Rfl: 0 Allergies: Zyprexa [olanzapine] and Penicillins  Complete Review of Systems: GENERAL: negative for malaise, night sweats HEENT: No changes in hearing or vision, no nose bleeds or other nasal problems. NECK: Negative for lumps, goiter, pain and significant neck swelling RESPIRATORY: Negative for cough, wheezing CARDIOVASCULAR: Negative for chest pain, leg swelling, palpitations, orthopnea GI: SEE HPI MUSCULOSKELETAL: Negative for joint pain or swelling, back pain, and muscle pain. SKIN: Negative for lesions, rash PSYCH: Negative for sleep disturbance, mood disorder and recent psychosocial stressors. HEMATOLOGY Negative for prolonged bleeding, bruising easily, and swollen nodes. ENDOCRINE: Negative for cold or heat intolerance, polyuria, polydipsia and goiter. NEURO: negative for tremor, gait imbalance, syncope and seizures. The remainder of the review of systems is noncontributory.  Physical Exam: BP (!) 140/87   Pulse 79   Temp 98.1 F (36.7 C) (Oral)   Resp 17   Ht 6' (1.829 m)   Wt (!) 138.3 kg   SpO2 95%   BMI 41.37 kg/m  GENERAL: The patient is AO x3, in no acute distress. HEENT: Head is normocephalic and atraumatic. EOMI are intact. Mouth is well hydrated and without lesions. NECK: Supple. No masses LUNGS: Clear to auscultation. No presence of rhonchi/wheezing/rales. Adequate chest expansion HEART: RRR, normal s1 and s2. ABDOMEN: Soft, nontender, no guarding, no peritoneal signs, and nondistended. BS +. No masses. EXTREMITIES: Without any cyanosis, clubbing,  rash, lesions or edema. NEUROLOGIC: AOx3, no focal motor deficit. SKIN: no jaundice, no rashes  Laboratory Data CBC:     Component Value Date/Time   WBC 5.9 03/17/2020 0929   RBC 4.34 03/17/2020 0929   HGB 13.2 03/17/2020 0929   HGB 13.0 03/06/2012 2022   HCT 39.7 03/17/2020 0929   HCT 38.0 (L) 03/06/2012 2022   PLT 257 03/17/2020 0929   PLT 214 03/06/2012 2022   MCV 91.5 03/17/2020 0929   MCV 91 03/06/2012 2022   MCH 30.4 03/17/2020 0929   MCHC 33.2 03/17/2020 0929   RDW 12.1 03/17/2020 0929   RDW 12.5 03/06/2012 2022   LYMPHSABS 1.2 12/23/2011 1921   MONOABS 0.5 12/23/2011 1921   EOSABS 0.2 12/23/2011 1921  BASOSABS 0.0 12/23/2011 1921   COAG: No results found for: "INR", "PROTIME"  BMP:     Latest Ref Rng & Units 03/17/2020    9:29 AM 03/06/2012    8:22 PM 12/23/2011    7:21 PM  BMP  Glucose 70 - 99 mg/dL 91  84  92   BUN 6 - 20 mg/dL 15  15  14    Creatinine 0.61 - 1.24 mg/dL 1.61  0.96  0.45   Sodium 135 - 145 mmol/L 139  141  141   Potassium 3.5 - 5.1 mmol/L 4.0  3.7  3.4   Chloride 98 - 111 mmol/L 105  108  105   CO2 22 - 32 mmol/L 22  26  25    Calcium 8.9 - 10.3 mg/dL 9.2  8.7  9.4     HEPATIC:     Latest Ref Rng & Units 03/06/2012    8:22 PM 12/23/2011    7:21 PM  Hepatic Function  Total Protein 6.4 - 8.2 g/dL 7.1  7.0   Albumin 3.4 - 5.0 g/dL 4.1  4.1   AST 15 - 37 Unit/L 26  17   ALT U/L 28  14   Alk Phosphatase 50 - 136 Unit/L 50  50   Total Bilirubin 0.2 - 1.0 mg/dL 0.3  0.3     CARDIAC: No results found for: "CKTOTAL", "CKMB", "CKMBINDEX", "TROPONINI"   Imaging: I personally reviewed and interpreted the available imaging.  Assessment & Plan:  This is a 42 year old male with history of Schizoaffective disorder , who comes to the hospital for evaluation after presenting an episode of food impaction.   Will proceed with urgent EGD as patient appears to be impacted as unable to tolerate even liquids Keep NPO  Vista Lawman,  MD Gastroenterology and Hepatology [facility]  This chart has been completed using [facility] Dictation software, and while attempts have been made to ensure accuracy , certain words and phrases may not be transcribed as intended

## 2023-08-17 NOTE — ED Notes (Signed)
Pt states he cannot hold anything down when he drinks. No redness noted in the back of throat or swelling. Pt states he has no pain. He has no complaints other than not being able to eat or drink.

## 2023-08-17 NOTE — Transfer of Care (Signed)
Immediate Anesthesia Transfer of Care Note  Patient: Devin Allen  Procedure(s) Performed: ESOPHAGOGASTRODUODENOSCOPY (EGD) WITH PROPOFOL BIOPSY REMOVAL FOREIGN BODY ESOPHAGEAL  Patient Location: PACU  Anesthesia Type:General  Level of Consciousness: awake and alert   Airway & Oxygen Therapy: Patient Spontanous Breathing  Post-op Assessment: Report given to RN and Post -op Vital signs reviewed and stable  Post vital signs: Reviewed and stable  Last Vitals:  Vitals Value Taken Time  BP    Temp    Pulse 89 08/17/23 1234  Resp 23 08/17/23 1234  SpO2 98 % 08/17/23 1234    Last Pain:  Vitals:   08/17/23 1002  TempSrc:   PainSc: 0-No pain         Complications: No notable events documented.

## 2023-08-17 NOTE — ED Triage Notes (Addendum)
Pt reports chicken got stuck in throat last night around 1700 while eating dinner, no distress noted, airway patent

## 2023-08-17 NOTE — Op Note (Signed)
[facility] Patient Name: Devin Allen Procedure Date: 08/17/2023 11:42 AM MRN: [id number] Date of Birth: 10-Jun-1981 Attending MD: Sanjuan Dame , MD, [id number] CSN: [id number] Age: 42 Admit Type: Outpatient Procedure:                Upper GI endoscopy Indications:              Foreign body in the esophagus Providers:                Sanjuan Dame, MD, Nena Polio, RN, Zena Amos Referring MD:              Medicines:                Monitored Anesthesia Care Complications:            No immediate complications. Estimated Blood Loss:     Estimated blood loss was minimal. Procedure:                Pre-Anesthesia Assessment:                           - Prior to the procedure, a History and Physical                            was performed, and patient medications and                            allergies were reviewed. The patient's tolerance of                            previous anesthesia was also reviewed. The risks                            and benefits of the procedure and the sedation                            options and risks were discussed with the patient.                            All questions were answered, and informed consent                            was obtained. Prior Anticoagulants: The patient has                            taken no anticoagulant or antiplatelet agents. ASA                            Grade Assessment: III - A patient with severe                            systemic disease. After reviewing the risks and                            benefits, the patient was deemed in satisfactory  condition to undergo the procedure.                           After obtaining informed consent, the endoscope was                            passed under direct vision. Throughout the                            procedure, the patient's blood pressure, pulse, and                            oxygen saturations were monitored continuously.  The                            GIF-H190 ([id number]) scope was introduced through the                            mouth, and advanced to the second part of duodenum.                            The upper GI endoscopy was accomplished without                            difficulty. The patient tolerated the procedure                            well. Scope In: 12:06:06 PM Scope Out: 12:17:18 PM Total Procedure Duration: 0 hours 11 minutes 12 seconds  Findings:      Food was found in the middle third of the esophagus. Removal was       accomplished with a rat-toothed forceps and snare.      Mucosal changes including ringed esophagus, feline appearance and       longitudinal furrows were found in the entire esophagus. Biopsies were       obtained from the proximal and distal esophagus with cold forceps for       histology of suspected eosinophilic esophagitis.      The entire examined stomach was normal.      The duodenal bulb and second portion of the duodenum were normal. Impression:               - Food in the middle third of the esophagus.                            Removal was successful.                           - Esophageal mucosal changes suggestive of                            eosinophilic esophagitis.                           - Normal stomach.                           -  Normal duodenal bulb and second portion of the                            duodenum.                           - Biopsies were taken with a cold forceps for                            evaluation of eosinophilic esophagitis. Moderate Sedation:      Per Anesthesia Care Recommendation:           - Patient has a contact number available for                            emergencies. The signs and symptoms of potential                            delayed complications were discussed with the                            patient. Return to normal activities tomorrow.                            Written discharge instructions  were provided to the                            patient.                           - Resume previous diet.                           - Continue present medications.                           - Await pathology results.                           - Repeat upper endoscopy in 8 weeks for                            surveillance.                           - Return to GI clinic at appointment to be                            scheduled.                           -Omeprazole 20mg  , twice daily                           -EGD after 8 weeks to assess response to treatement                           -  Cut food in small pieces and chew food thoroughly                            to avoid regurgitation episodes. Procedure Code(s):        --- Professional ---                           [phone], Esophagogastroduodenoscopy, flexible,                            transoral; with removal of foreign body(s)                           43239, Esophagogastroduodenoscopy, flexible,                            transoral; with biopsy, single or multiple Diagnosis Code(s):        --- Professional ---                           U04.540J, Food in esophagus causing other injury,                            initial encounter                           K22.89, Other specified disease of esophagus                           T18.108A, Unspecified foreign body in esophagus                            causing other injury, initial encounter CPT copyright 2022 [employer]. All rights reserved. The codes documented in this report are preliminary and upon coder review may  be revised to meet current compliance requirements. Sanjuan Dame, MD Sanjuan Dame, MD 08/17/2023 12:37:54 PM This report has been signed electronically. Number of Addenda: 0

## 2023-08-17 NOTE — Discharge Instructions (Signed)
  Discharge instructions Please read the instructions outlined below and refer to this sheet in the next few weeks. These discharge instructions provide you with general information on caring for yourself after you leave the hospital. Your doctor may also give you specific instructions. While your treatment has been planned according to the most current medical practices available, unavoidable complications occasionally occur. If you have any problems or questions after discharge, please call your doctor. ACTIVITY You may resume your regular activity but move at a slower pace for the next 24 hours.  Take frequent rest periods for the next 24 hours.  Walking will help expel (get rid of) the air and reduce the bloated feeling in your abdomen.  No driving for 24 hours (because of the anesthesia (medicine) used during the test).  You may shower.  Do not sign any important legal documents or operate any machinery for 24 hours (because of the anesthesia used during the test).  NUTRITION Drink plenty of fluids.  You may resume your normal diet.  Begin with a light meal and progress to your normal diet.  Avoid alcoholic beverages for 24 hours or as instructed by your caregiver.  MEDICATIONS You may resume your normal medications unless your caregiver tells you otherwise.  WHAT YOU CAN EXPECT TODAY You may experience abdominal discomfort such as a feeling of fullness or "gas" pains.  FOLLOW-UP Your doctor will discuss the results of your test with you.  SEEK IMMEDIATE MEDICAL ATTENTION IF ANY OF THE FOLLOWING OCCUR: Excessive nausea (feeling sick to your stomach) and/or vomiting.  Severe abdominal pain and distention (swelling).  Trouble swallowing.  Temperature over 101 F (37.8 C).  Rectal bleeding or vomiting of blood.      -Please see [place] in the clinic in 3-4 weeks -eight-week course of treatment of PPI twice daily . (Omperazole) - Cut food in small pieces and chew food thoroughly to avoid  regurgitation episodes.   I hope you have a great rest of your week!   Vista Lawman , M.D.. Gastroenterology and Hepatology [facility]

## 2023-08-17 NOTE — Anesthesia Procedure Notes (Signed)
Procedure Name: Intubation Date/Time: 08/17/2023 11:56 AM  Performed by: Franco Nones, CRNAPre-anesthesia Checklist: Patient identified, Patient being monitored, Timeout performed, Emergency Drugs available and Suction available Patient Re-evaluated:Patient Re-evaluated prior to induction Oxygen Delivery Method: Circle system utilized Preoxygenation: Pre-oxygenation with 100% oxygen Induction Type: IV induction, Rapid sequence and Cricoid Pressure applied Ventilation: Mask ventilation without difficulty Laryngoscope Size: Mac and 4 Grade View: Grade I Tube type: Oral Tube size: 7.5 mm Number of attempts: 1 Airway Equipment and Method: Stylet Placement Confirmation: ETT inserted through vocal cords under direct vision, positive ETCO2 and breath sounds checked- equal and bilateral Secured at: 22 cm Tube secured with: Tape Dental Injury: Teeth and Oropharynx as per pre-operative assessment

## 2023-08-17 NOTE — Anesthesia Preprocedure Evaluation (Addendum)
Anesthesia Evaluation  Patient identified by MRN, date of birth, ID band Patient awake    Reviewed: Allergy & Precautions, H&P , NPO status , Patient's Chart, lab work & pertinent test results  Airway Mallampati: II   Neck ROM: full    Dental  (+) Dental Advisory Given, Teeth Intact, Chipped,    Pulmonary    Pulmonary exam normal breath sounds clear to auscultation       Cardiovascular hypertension, Normal cardiovascular exam Rhythm:regular Rate:Normal     Neuro/Psych Seizures -,  PSYCHIATRIC DISORDERS Anxiety Depression Bipolar Disorder Schizophrenia     GI/Hepatic negative GI ROS, Neg liver ROS,,,  Endo/Other    Morbid obesity  Renal/GU negative Renal ROSstones  negative genitourinary   Musculoskeletal negative musculoskeletal ROS (+)    Abdominal Normal abdominal exam  (+)   Peds  Hematology   Anesthesia Other Findings   Reproductive/Obstetrics                             Anesthesia Physical Anesthesia Plan  ASA: 3 and emergent  Anesthesia Plan: General   Post-op Pain Management: Minimal or no pain anticipated   Induction: Intravenous  PONV Risk Score and Plan: Ondansetron and Dexamethasone  Airway Management Planned: Oral ETT  Additional Equipment: None  Intra-op Plan:   Post-operative Plan: Extubation in OR  Informed Consent: I have reviewed the patients History and Physical, chart, labs and discussed the procedure including the risks, benefits and alternatives for the proposed anesthesia with the patient or authorized representative who has indicated his/her understanding and acceptance.     Dental advisory given  Plan Discussed with: CRNA, Anesthesiologist and Surgeon  Anesthesia Plan Comments:         Anesthesia Quick Evaluation

## 2023-08-17 NOTE — Anesthesia Postprocedure Evaluation (Signed)
Anesthesia Post Note  Patient: Devin Allen  Procedure(s) Performed: ESOPHAGOGASTRODUODENOSCOPY (EGD) WITH PROPOFOL BIOPSY REMOVAL FOREIGN BODY ESOPHAGEAL  Patient location during evaluation: PACU Anesthesia Type: General Level of consciousness: awake and alert Pain management: pain level controlled Vital Signs Assessment: post-procedure vital signs reviewed and stable Respiratory status: spontaneous breathing, nonlabored ventilation, respiratory function stable and patient connected to nasal cannula oxygen Cardiovascular status: blood pressure returned to baseline and stable Postop Assessment: no apparent nausea or vomiting Anesthetic complications: no   There were no known notable events for this encounter.   Last Vitals:  Vitals:   08/17/23 1245 08/17/23 1254  BP: (!) 149/96 (!) 140/85  Pulse: 88 92  Resp: (!) 28 (!) 22  Temp:  36.4 C  SpO2: 98% 94%    Last Pain:  Vitals:   08/17/23 1254  TempSrc:   PainSc: 0-No pain                 Debar Plate L Armine Rizzolo

## 2023-08-17 NOTE — ED Provider Notes (Signed)
[place] EMERGENCY DEPARTMENT AT [facility] Provider Note   CSN: [id number] Arrival date & time: 08/17/23  3086     History  Chief Complaint  Patient presents with   Foreign Body    Devin Allen is a 42 y.o. male.  HPI    42 year old male comes in with chief complaint of foreign body sensation.  Patient was eating a chicken thigh yesterday, and suddenly had some choking.  Thereafter, he continued to have some tightness in his throat and feeling that something is stuck.  He uses finger -and thinks that he felt something in the back.  Thereafter, patient tried to drink water and had choking sensation.  Decided not to eat or drink anything, but this morning when he woke up he noted that he was able to keep small sips of water down, but when he took a big gulp it felt like something was getting stuck in the back of his throat.  Patient actually ended up throwing up what he ingested, and decided to come to the ER.  Home Medications Prior to Admission medications   Medication Sig Start Date End Date Taking? Authorizing Provider  buPROPion (WELLBUTRIN XL) 300 MG 24 hr tablet Take 300 mg by mouth at bedtime.   Yes [provider]  clindamycin (CLEOCIN) 300 MG capsule Take 1 capsule (300 mg total) by mouth 3 (three) times daily. 03/17/20  Yes [name], DMD  diphenhydrAMINE (BENADRYL) 25 MG tablet Take 25 mg by mouth at bedtime as needed for sleep.   Yes [provider]  lisinopril (ZESTRIL) 10 MG tablet Take 10 mg by mouth daily. 02/05/20  Yes [provider]  LORazepam (ATIVAN) 0.5 MG tablet Take 0.5 mg by mouth 4 (four) times daily as needed for anxiety.    Yes [provider]  Omega-3 Fatty Acids (FISH OIL) 1000 MG CAPS Take 1,000 mg by mouth daily.   Yes [provider]  oxyCODONE-acetaminophen (PERCOCET) 5-325 MG tablet Take 1 tablet by mouth every 4 (four) hours as needed. 03/17/20  Yes [name], DMD  INVEGA TRINZA 819  MG/2.625ML SUSY Take 819 mg by mouth every 3 (three) months. 12/15/19   [provider]      Allergies    Zyprexa [olanzapine] and Penicillins    Review of Systems   Review of Systems  All other systems reviewed and are negative.   Physical Exam Updated Vital Signs BP (!) 178/95 (BP Location: Right Arm)   Pulse 79   Temp 97.9 F (36.6 C)   Resp 20   Ht 6' (1.829 m)   Wt (!) 138.3 kg   SpO2 98%   BMI 41.37 kg/m  Physical Exam Vitals and nursing note reviewed.  Constitutional:      Appearance: He is well-developed.  HENT:     Head: Atraumatic.  Cardiovascular:     Rate and Rhythm: Normal rate.  Pulmonary:     Effort: Pulmonary effort is normal.  Musculoskeletal:     Cervical back: Neck supple.  Skin:    General: Skin is warm.  Neurological:     Mental Status: He is alert and oriented to person, place, and time.     ED Results / Procedures / Treatments   Labs (all labs ordered are listed, but only abnormal results are displayed) Labs Reviewed  CBC - Abnormal; Notable for the following components:      Result Value   RBC 4.20 (*)    Hemoglobin 12.8 (*)  HCT 37.5 (*)    All other components within normal limits  I-STAT CHEM 8, ED    EKG None  Radiology DG Chest 1 View  Result Date: 08/17/2023 CLINICAL DATA:  A chicken thigh last night with globus sensation today. EXAM: CHEST  1 VIEW COMPARISON:  None Available. FINDINGS: Normal heart size and mediastinal contours. No acute infiltrate or edema. No effusion or pneumothorax. No acute osseous findings. IMPRESSION: Normal frontal chest. Electronically Signed   By: Tiburcio Pea M.D.   On: 08/17/2023 07:15   DG Neck Soft Tissue  Result Date: 08/17/2023 CLINICAL DATA:  Globus sensation after eating chicken last night. EXAM: NECK SOFT TISSUES - 2 VIEW COMPARISON:  None Available. FINDINGS: No evidence of airway swelling or air leak. No opaque foreign body is seen. Small calcifications overlap the  bilateral parotid. IMPRESSION: No evidence of swelling or foreign body. Electronically Signed   By: Tiburcio Pea M.D.   On: 08/17/2023 07:15    Procedures Procedures    Medications Ordered in ED Medications  0.9 %  sodium chloride infusion (has no administration in time range)  glucagon (human recombinant) (GLUCAGEN) injection 1 mg (1 mg Intravenous Given 08/17/23 0906)  ondansetron (ZOFRAN) injection 4 mg (4 mg Intravenous Given 08/17/23 0909)    ED Course/ Medical Decision Making/ A&P                                 Medical Decision Making Amount and/or Complexity of Data Reviewed Labs: ordered.  Risk Prescription drug management. Decision regarding hospitalization.   42 year old male comes in with chief complaint of foreign body sensation to the back of his throat and difficulty in swallowing.  He has no known esophageal dysmotility issues.  Patient's exam is overall reassuring.  No crepitus.  He is not drooling.  Differential diagnosis considered for him includes food impaction, globus sensation, microperforation to the esophagus, esophageal trauma.  X-ray of the neck and chest were ordered from triage, have independently interpreted the x-ray and there is no evidence of foreign body or free air.  I attempted to give patient some water.  He was able to keep small sips of water down.  However, about 10 minutes after I left him he ended up throwing up the water.  I consulted GI, spoke with Dr. Tasia Catchings. He will come and scope the patient.  Patient made aware of this plan.  Final Clinical Impression(s) / ED Diagnoses Final diagnoses:  Food impaction of esophagus, initial encounter    Rx / DC Orders ED Discharge Orders     None         Derwood Kaplan, MD 08/17/23 1024

## 2023-08-17 NOTE — H&P (View-Only) (Signed)
Vista Lawman, M.D. Gastroenterology & Hepatology                                           Patient Name: Devin Allen Account #: @FLAACCTNO @   MRN: [id number] Admission Date: 08/17/2023 Date of Evaluation:  08/17/2023 Time of Evaluation: 8:46 AM   Chief Complaint:  Food impaction   HPI:  This is a 42 year old male with history of Schizoaffective disorder , who comes to the hospital for evaluation after presenting an episode of food impaction. The patient reports he was eating Chicken at 5 PM last and he felt the food got stuck in the retrosternal area. He has a sense of something stuck at the back of the throat . In ED although he is not distressed , he was able to swallow water but came back up after few minutes   Patient reports similar presentation twice so far , 8 years and 5 years ago . No prior endoscopies  Drooling:None Able to swallow food:No Able to drink liquids"Yes, but unable to keep it down Previous reflux symptoms:No Weight changes:No Seasonal allegies: No  Previous episodes: None  Previous WGN:FAOZ Previous colonoscopy:None   In the ER, his vital signs were WNL. He was protecting his airway. Labs were notable for 12.8. He underwent the following imaging CXR and Neck soft tissue xray WNL .   Past Medical History: SEE CHRONIC ISSSUES: Past Medical History:  Diagnosis Date   Anxiety    Bipolar disorder (HCC)    Depression    History of kidney stones    passed   Hypertension    Panic attack    Schizophrenia (HCC)    Seizure (HCC) 2013   from zyprexa   Past Surgical History:  Past Surgical History:  Procedure Laterality Date   HERNIA REPAIR     age 37   TOOTH EXTRACTION N/A 03/17/2020   Procedure: DENTAL RESTORATION/EXTRACTIONS;  Surgeon: [name], DDS;  Location: [facility] OR;  Service: Oral Surgery;  Laterality: N/A;   Family History: History reviewed. No pertinent family history. Social History:  Social History   Tobacco Use   Smoking  status: Never   Smokeless tobacco: Never  Vaping Use   Vaping status: Never Used  Substance Use Topics   Alcohol use: No   Drug use: No    Home Medications:  Prior to Admission medications   Medication Sig Start Date End Date Taking? Authorizing Provider  buPROPion (WELLBUTRIN XL) 300 MG 24 hr tablet Take 300 mg by mouth at bedtime.    [provider]  clindamycin (CLEOCIN) 300 MG capsule Take 1 capsule (300 mg total) by mouth 3 (three) times daily. 03/17/20   [name], DMD  diphenhydrAMINE (BENADRYL) 25 MG tablet Take 25 mg by mouth at bedtime as needed for sleep.    [provider]  INVEGA TRINZA 819 MG/2.625ML SUSY Take 819 mg by mouth every 3 (three) months. 12/15/19   [provider]  lisinopril (ZESTRIL) 10 MG tablet Take 10 mg by mouth daily. 02/05/20   [provider]  LORazepam (ATIVAN) 0.5 MG tablet Take 0.5 mg by mouth 4 (four) times daily as needed for anxiety.     [provider]  Omega-3 Fatty Acids (FISH OIL) 1000 MG CAPS Take 1,000 mg by mouth daily.    [provider]  oxyCODONE-acetaminophen (PERCOCET) 5-325 MG  tablet Take 1 tablet by mouth every 4 (four) hours as needed. 03/17/20   [name], DMD    Inpatient Medications:  Current Facility-Administered Medications:    glucagon (human recombinant) (GLUCAGEN) injection 1 mg, 1 mg, Intravenous, Once, Nanavati, Ankit, MD   ondansetron (ZOFRAN) injection 4 mg, 4 mg, Intravenous, Once, Nanavati, Ankit, MD  Current Outpatient Medications:    buPROPion (WELLBUTRIN XL) 300 MG 24 hr tablet, Take 300 mg by mouth at bedtime., Disp: , Rfl:    clindamycin (CLEOCIN) 300 MG capsule, Take 1 capsule (300 mg total) by mouth 3 (three) times daily., Disp: 21 capsule, Rfl: 0   diphenhydrAMINE (BENADRYL) 25 MG tablet, Take 25 mg by mouth at bedtime as needed for sleep., Disp: , Rfl:    INVEGA TRINZA 819 MG/2.625ML SUSY, Take 819 mg by mouth every 3 (three) months., Disp: , Rfl:     lisinopril (ZESTRIL) 10 MG tablet, Take 10 mg by mouth daily., Disp: , Rfl:    LORazepam (ATIVAN) 0.5 MG tablet, Take 0.5 mg by mouth 4 (four) times daily as needed for anxiety. , Disp: , Rfl:    Omega-3 Fatty Acids (FISH OIL) 1000 MG CAPS, Take 1,000 mg by mouth daily., Disp: , Rfl:    oxyCODONE-acetaminophen (PERCOCET) 5-325 MG tablet, Take 1 tablet by mouth every 4 (four) hours as needed., Disp: 30 tablet, Rfl: 0 Allergies: Zyprexa [olanzapine] and Penicillins  Complete Review of Systems: GENERAL: negative for malaise, night sweats HEENT: No changes in hearing or vision, no nose bleeds or other nasal problems. NECK: Negative for lumps, goiter, pain and significant neck swelling RESPIRATORY: Negative for cough, wheezing CARDIOVASCULAR: Negative for chest pain, leg swelling, palpitations, orthopnea GI: SEE HPI MUSCULOSKELETAL: Negative for joint pain or swelling, back pain, and muscle pain. SKIN: Negative for lesions, rash PSYCH: Negative for sleep disturbance, mood disorder and recent psychosocial stressors. HEMATOLOGY Negative for prolonged bleeding, bruising easily, and swollen nodes. ENDOCRINE: Negative for cold or heat intolerance, polyuria, polydipsia and goiter. NEURO: negative for tremor, gait imbalance, syncope and seizures. The remainder of the review of systems is noncontributory.  Physical Exam: BP (!) 140/87   Pulse 79   Temp 98.1 F (36.7 C) (Oral)   Resp 17   Ht 6' (1.829 m)   Wt (!) 138.3 kg   SpO2 95%   BMI 41.37 kg/m  GENERAL: The patient is AO x3, in no acute distress. HEENT: Head is normocephalic and atraumatic. EOMI are intact. Mouth is well hydrated and without lesions. NECK: Supple. No masses LUNGS: Clear to auscultation. No presence of rhonchi/wheezing/rales. Adequate chest expansion HEART: RRR, normal s1 and s2. ABDOMEN: Soft, nontender, no guarding, no peritoneal signs, and nondistended. BS +. No masses. EXTREMITIES: Without any cyanosis, clubbing,  rash, lesions or edema. NEUROLOGIC: AOx3, no focal motor deficit. SKIN: no jaundice, no rashes  Laboratory Data CBC:     Component Value Date/Time   WBC 5.9 03/17/2020 0929   RBC 4.34 03/17/2020 0929   HGB 13.2 03/17/2020 0929   HGB 13.0 03/06/2012 2022   HCT 39.7 03/17/2020 0929   HCT 38.0 (L) 03/06/2012 2022   PLT 257 03/17/2020 0929   PLT 214 03/06/2012 2022   MCV 91.5 03/17/2020 0929   MCV 91 03/06/2012 2022   MCH 30.4 03/17/2020 0929   MCHC 33.2 03/17/2020 0929   RDW 12.1 03/17/2020 0929   RDW 12.5 03/06/2012 2022   LYMPHSABS 1.2 12/23/2011 1921   MONOABS 0.5 12/23/2011 1921   EOSABS 0.2 12/23/2011 1921  BASOSABS 0.0 12/23/2011 1921   COAG: No results found for: "INR", "PROTIME"  BMP:     Latest Ref Rng & Units 03/17/2020    9:29 AM 03/06/2012    8:22 PM 12/23/2011    7:21 PM  BMP  Glucose 70 - 99 mg/dL 91  84  92   BUN 6 - 20 mg/dL 15  15  14    Creatinine 0.61 - 1.24 mg/dL 1.61  0.96  0.45   Sodium 135 - 145 mmol/L 139  141  141   Potassium 3.5 - 5.1 mmol/L 4.0  3.7  3.4   Chloride 98 - 111 mmol/L 105  108  105   CO2 22 - 32 mmol/L 22  26  25    Calcium 8.9 - 10.3 mg/dL 9.2  8.7  9.4     HEPATIC:     Latest Ref Rng & Units 03/06/2012    8:22 PM 12/23/2011    7:21 PM  Hepatic Function  Total Protein 6.4 - 8.2 g/dL 7.1  7.0   Albumin 3.4 - 5.0 g/dL 4.1  4.1   AST 15 - 37 Unit/L 26  17   ALT U/L 28  14   Alk Phosphatase 50 - 136 Unit/L 50  50   Total Bilirubin 0.2 - 1.0 mg/dL 0.3  0.3     CARDIAC: No results found for: "CKTOTAL", "CKMB", "CKMBINDEX", "TROPONINI"   Imaging: I personally reviewed and interpreted the available imaging.  Assessment & Plan:  This is a 42 year old male with history of Schizoaffective disorder , who comes to the hospital for evaluation after presenting an episode of food impaction.   Will proceed with urgent EGD as patient appears to be impacted as unable to tolerate even liquids Keep NPO  Vista Lawman,  MD Gastroenterology and Hepatology [facility]  This chart has been completed using [facility] Dictation software, and while attempts have been made to ensure accuracy , certain words and phrases may not be transcribed as intended

## 2023-08-17 NOTE — Interval H&P Note (Signed)
History and Physical Interval Note:  08/17/2023 10:34 AM  Devin Allen  has presented today for surgery, with the diagnosis of Food impaction.  The various methods of treatment have been discussed with the patient and family. After consideration of risks, benefits and other options for treatment, the patient has consented to  Procedure(s): ESOPHAGOGASTRODUODENOSCOPY (EGD) WITH PROPOFOL (N/A) as a surgical intervention.  The patient's history has been reviewed, patient examined, no change in status, stable for surgery.  I have reviewed the patient's chart and labs.  Questions were answered to the patient's satisfaction.    Spoke with patient mother also in detail bedside   We will proceed with EGD  I thoroughly discussed with the patient and the mother of the patient the procedure, including the risks involved. Patient understands what the procedure involves including the benefits and any risks. Patient understands alternatives to the proposed procedure. Risks including (but not limited to) bleeding, tearing of the lining (perforation), rupture of adjacent organs, problems with heart and lung function, infection, and medication reactions. A small percentage of complications may require surgery, hospitalization, repeat endoscopic procedure, and/or transfusion.  Patient understood and agreed.    [name] Cotey Rakes

## 2023-08-20 LAB — SURGICAL PATHOLOGY

## 2023-08-20 NOTE — Addendum Note (Signed)
Addendum  created 08/20/23 1348 by Moshe Salisbury, CRNA   Attestation recorded in [place], Intraprocedure Attestations filed, Optician, dispensing edited

## 2023-08-20 NOTE — Progress Notes (Signed)
I reviewed the pathology results. Devin Allen, can you send her a letter with the findings as described below please?  Repeat upper endoscopy in 12 weeks  Thanks,  Vista Lawman, MD Gastroenterology and Hepatology [facility]  ---------------------------------------------------------------------------------------------  [facility] 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 This build-up can lead to symptoms such as difficulty swallowing, food getting stuck, and chest pain. EOE is often related to food allergies or other environmental factors. Treatment typically involves dietary changes, such as eliminating specific allergens, and medications like proton pump inhibitors or topical steroids to reduce inflammation and manage symptoms. We will work closely with you to develop a personalized treatment plan to help you manage this condition effectively.  Recommend:   eight-week course of treatment of omeprazole 20 mg  twice daily . We will perform upper endoscopy after 8 to 12 weeks of therapy to assess endoscopic and histologic response.  Please continue to see [place] in the clinic ; appointment 10/09/2023  Please call us at [phone] if you have persistent problems or have questions about your condition that have not been fully answered at this time.  Sincerely,  Vista Lawman, MD Gastroenterology and Hepatology

## 2023-08-22 ENCOUNTER — Encounter (INDEPENDENT_AMBULATORY_CARE_PROVIDER_SITE_OTHER): Payer: Self-pay | Admitting: *Deleted

## 2023-08-27 ENCOUNTER — Encounter (HOSPITAL_COMMUNITY): Payer: Self-pay | Admitting: Gastroenterology

## 2023-10-09 ENCOUNTER — Ambulatory Visit (INDEPENDENT_AMBULATORY_CARE_PROVIDER_SITE_OTHER): Payer: Medicare HMO | Admitting: Gastroenterology

## 2023-10-09 ENCOUNTER — Encounter (INDEPENDENT_AMBULATORY_CARE_PROVIDER_SITE_OTHER): Payer: Self-pay | Admitting: Gastroenterology

## 2023-10-09 VITALS — BP 168/96 | HR 74 | Temp 97.8°F | Ht 72.0 in | Wt 309.8 lb

## 2023-10-09 DIAGNOSIS — K200 Eosinophilic esophagitis: Secondary | ICD-10-CM

## 2023-10-09 MED ORDER — OMEPRAZOLE 20 MG PO CPDR: 20.0000 mg | DELAYED_RELEASE_CAPSULE | Freq: Two times a day (BID) | ORAL | 2 refills | Status: AC

## 2023-10-09 NOTE — Patient Instructions (Signed)
Please resume Omeprazole 20mg  twice daily Continue to avoid foods such as chicken that cause you issues Make sure to take small bites, chew well and take sips of liquids between bites to avoid choking  Follow up 3 months

## 2023-10-09 NOTE — Progress Notes (Signed)
Referring Provider: No ref. provider found Primary Care Physician:  Marylynn Pearson, FNP Primary GI Physician: Dr. Tasia Catchings  Chief Complaint  Patient presents with   Follow-up    Patient here today for a follow up from recent hospitalization due to food impaction on 08/17/2023. Patient has not had any more issues with this since the hospitalization. He says his issue is usually with chicken.  He is was taking omeprazole 20 mg bid, but has since ran out of this.   HPI:   Devin Allen is a 42 y.o. 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  Patient underwent EGD with food and middle third of the esophagus, removal successful.  Esophageal mucosal changes suggestive of EOE.  Patient advised to repeat upper endoscopy in 8 weeks for surveillance, omeprazole 20 mg twice daily, chewing precautions. Pathology consistent with EOE   Present: States he was taking omeprazole 20mg  BID, which he was doing but states he recently ran out. States swallowing seems to be doing okay. He notes that chicken is the only food that tends to bother him, he is eating more canned chicken now which he tolerates better as it is chopped finely. He reports approximately 4-5 other episodes of significant dysphagia over the past few years prior to food impaction in October. Denies dysphagia with other foods. No sore throat or odynophagia. Had one episode of heartburn after eating liver pudding but otherwise no GERD symptoms.  No red flag symptoms. Patient denies melena, hematochezia, nausea, vomiting, diarrhea, constipation, early satiety or weight loss.   Last Colonoscopy: never Last Endoscopy:08/17/23 as above  Recommendations:    Past Medical History:  Diagnosis Date   Anxiety    Bipolar disorder (HCC)    Depression    History of kidney stones     passed   Hypertension    Panic attack    Schizophrenia (HCC)    Seizure (HCC) 2013   from zyprexa    Past Surgical History:  Procedure Laterality Date   BIOPSY  08/17/2023   Procedure: BIOPSY;  Surgeon: Franky Macho, MD;  Location: [facility];  Service: Endoscopy;;   ESOPHAGOGASTRODUODENOSCOPY (EGD) WITH PROPOFOL N/A 08/17/2023   Procedure: ESOPHAGOGASTRODUODENOSCOPY (EGD) WITH PROPOFOL;  Surgeon: Franky Macho, MD;  Location: [facility];  Service: Endoscopy;  Laterality: N/A;   FOREIGN BODY REMOVAL ESOPHAGEAL  08/17/2023   Procedure: REMOVAL FOREIGN BODY ESOPHAGEAL;  Surgeon: Franky Macho, MD;  Location: [facility];  Service: Endoscopy;;   HERNIA REPAIR     age 63   TOOTH EXTRACTION N/A 03/17/2020   Procedure: DENTAL RESTORATION/EXTRACTIONS;  Surgeon: [name], DDS;  Location: [facility] OR;  Service: Oral Surgery;  Laterality: N/A;    Current Outpatient Medications  Medication Sig Dispense Refill   buPROPion (WELLBUTRIN XL) 300 MG 24 hr tablet Take 300 mg by mouth at bedtime.     busPIRone (BUSPAR) 10 MG tablet Take 10 mg by mouth 3 (three) times daily.     diphenhydrAMINE (BENADRYL) 25 MG tablet Take 25 mg by mouth at bedtime as needed for sleep.     INVEGA TRINZA 819 MG/2.625ML SUSY Take 819 mg by mouth every 3 (three) months.     lisdexamfetamine (VYVANSE) 30 MG capsule Take 30 mg by mouth every morning.     lisinopril (ZESTRIL) 10 MG  tablet Take 10 mg by mouth daily.     LORazepam (ATIVAN) 0.5 MG tablet Take 0.5 mg by mouth 4 (four) times daily as needed for anxiety.      Omega-3 Fatty Acids (FISH OIL) 1000 MG CAPS Take 1,000 mg by mouth daily.     Vilazodone HCl (VIIBRYD) 40 MG TABS Take 40 mg by mouth daily.     omeprazole (PRILOSEC) 20 MG capsule Take 1 capsule (20 mg total) by mouth 2 (two) times daily before a meal. Take 30 min before breakfast and 30 min before dinner (Patient not taking: Reported on 10/09/2023) 60 capsule 1   No current  facility-administered medications for this visit.    Allergies as of 10/09/2023 - Review Complete 10/09/2023  Allergen Reaction Noted   Zyprexa [olanzapine] Other (See Comments) 03/16/2020   Penicillins  12/24/2011    History reviewed. No pertinent family history.  Social History   Socioeconomic History   Marital status: Married    Spouse name: Not on file   Number of children: Not on file   Years of education: Not on file   Highest education level: Not on file  Occupational History   Not on file  Tobacco Use   Smoking status: Never   Smokeless tobacco: Never  Vaping Use   Vaping status: Never Used  Substance and Sexual Activity   Alcohol use: No   Drug use: No   Sexual activity: Yes    Birth control/protection: None  Other Topics Concern   Not on file  Social History Narrative   Not on file   Social Determinants of Health   Financial Resource Strain: Not on file  Food Insecurity: Not on file  Transportation Needs: Not on file  Physical Activity: Not on file  Stress: Not on file  Social Connections: Not on file    Review of systems General: negative for malaise, night sweats, fever, chills, weight loss Neck: Negative for lumps, goiter, pain and significant neck swelling Resp: Negative for cough, wheezing, dyspnea at rest CV: Negative for chest pain, leg swelling, palpitations, orthopnea GI: denies melena, hematochezia, nausea, vomiting, diarrhea, constipation, odynophagia, early satiety or unintentional weight loss. +dysphagia  with certain types of chicken The remainder of the review of systems is noncontributory.  Physical Exam: BP (!) 168/96 (BP Location: Left Arm, Patient Position: Sitting, Cuff Size: Large)   Pulse 74   Temp 97.8 F (36.6 C) (Temporal)   Ht 6' (1.829 m)   Wt (!) 309 lb 12.8 oz (140.5 kg)   BMI 42.02 kg/m  General:   Alert and oriented. No distress noted. Pleasant and cooperative.  Head:  Normocephalic and atraumatic. Eyes:   Conjuctiva clear without scleral icterus. Mouth:  Oral mucosa pink and moist. Good dentition. No lesions. Heart: Normal rate and rhythm, s1 and s2 heart sounds present.  Lungs: Clear lung sounds in all lobes. Respirations equal and unlabored. Abdomen:  +BS, soft, non-tender and non-distended. No rebound or guarding. No HSM or masses noted. Neurologic:  Alert and  oriented x4 Psych:  Alert and cooperative. Normal mood and affect.  Invalid input(s): "6 MONTHS"   ASSESSMENT: Devin Allen is a 42 y.o. 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  Notes that he tends to only have issues with dysphagia with certain types of chicken.  No  dysphagia with foods, liquids, pills.  He has no GERD symptoms.  I discussed explanation of findings of EOE, to include treatment with the patient, who verbalized understanding.  For now we will resume omeprazole 20 mg twice daily, schedule repeat upper endoscopy to evaluate for healing of EOE 8 to 12 weeks after initial EGD on 10/4. Indications, risks and benefits of procedure discussed in detail with patient. Patient verbalized understanding and is in agreement to proceed with EGD    PLAN:  Resume omprazole 20mg  BID  2. Continue to avoid chicken, good chewing precautions 3. Repeat EGD 8-12 weeks after initial EGD on 10/4 4. May Consider elimination diet or other treatments such as fluticasone if EOE persist   All questions were answered, patient verbalized understanding and is in agreement with plan as outlined above.    Follow Up: 3 months   Haisley Arens L. Jeanmarie Hubert, MSN, APRN, AGNP-C Adult-Gerontology Nurse Practitioner [facility]

## 2023-10-31 ENCOUNTER — Encounter (INDEPENDENT_AMBULATORY_CARE_PROVIDER_SITE_OTHER): Payer: Self-pay | Admitting: *Deleted

## 2023-10-31 ENCOUNTER — Encounter (HOSPITAL_COMMUNITY): Payer: Self-pay | Admitting: Gastroenterology

## 2023-11-05 ENCOUNTER — Telehealth (INDEPENDENT_AMBULATORY_CARE_PROVIDER_SITE_OTHER): Payer: Self-pay | Admitting: Gastroenterology

## 2023-11-05 NOTE — Telephone Encounter (Signed)
Left message to return call to schedule EGD with Dr.Ahmed

## 2023-11-12 NOTE — Telephone Encounter (Signed)
Left message to return call 

## 2023-12-25 NOTE — Telephone Encounter (Signed)
EGD [facility] 3 Dr.AHmed

## 2023-12-25 NOTE — Telephone Encounter (Signed)
Left message to return call. Will also send letter.

## 2024-12-24 ENCOUNTER — Ambulatory Visit (INDEPENDENT_AMBULATORY_CARE_PROVIDER_SITE_OTHER): Admitting: Gastroenterology

## 2024-12-29 ENCOUNTER — Ambulatory Visit (INDEPENDENT_AMBULATORY_CARE_PROVIDER_SITE_OTHER): Payer: Medicare HMO | Admitting: Gastroenterology
